# Patient Record
Sex: Male | Born: 1955
Health system: Southern US, Community
[De-identification: ages and names within clinical notes are randomized; demographics above are authoritative.]

## PROBLEM LIST (undated history)

## (undated) DIAGNOSIS — D509 Iron deficiency anemia, unspecified: Secondary | ICD-10-CM

## (undated) DIAGNOSIS — M353 Polymyalgia rheumatica: Secondary | ICD-10-CM

## (undated) DIAGNOSIS — H43812 Vitreous degeneration, left eye: Secondary | ICD-10-CM

## (undated) DIAGNOSIS — IMO0001 Reserved for inherently not codable concepts without codable children: Secondary | ICD-10-CM

## (undated) DIAGNOSIS — M47812 Spondylosis without myelopathy or radiculopathy, cervical region: Secondary | ICD-10-CM

## (undated) DIAGNOSIS — R404 Transient alteration of awareness: Secondary | ICD-10-CM

## (undated) DIAGNOSIS — E785 Hyperlipidemia, unspecified: Secondary | ICD-10-CM

## (undated) DIAGNOSIS — H269 Unspecified cataract: Secondary | ICD-10-CM

## (undated) DIAGNOSIS — Z800 Family history of malignant neoplasm of digestive organs: Secondary | ICD-10-CM

## (undated) DIAGNOSIS — M109 Gout, unspecified: Secondary | ICD-10-CM

## (undated) DIAGNOSIS — J309 Allergic rhinitis, unspecified: Secondary | ICD-10-CM

## (undated) DIAGNOSIS — K589 Irritable bowel syndrome without diarrhea: Secondary | ICD-10-CM

## (undated) DIAGNOSIS — K5790 Diverticulosis of intestine, part unspecified, without perforation or abscess without bleeding: Secondary | ICD-10-CM

## (undated) HISTORY — DX: Family history of malignant neoplasm of digestive organs: Z80.0

## (undated) HISTORY — DX: Transient alteration of awareness: R40.4

## (undated) HISTORY — DX: Polymyalgia rheumatica: M35.3

## (undated) HISTORY — DX: Iron deficiency anemia, unspecified: D50.9

## (undated) HISTORY — DX: Allergic rhinitis, unspecified: J30.9

## (undated) HISTORY — DX: Diverticulosis of intestine, part unspecified, without perforation or abscess without bleeding: K57.90

## (undated) HISTORY — DX: Hyperlipidemia, unspecified: E78.5

## (undated) HISTORY — DX: Gout, unspecified: M10.9

## (undated) HISTORY — DX: Irritable bowel syndrome without diarrhea: K58.9

## (undated) HISTORY — DX: Vitreous degeneration, left eye: H43.812

## (undated) HISTORY — DX: Reserved for inherently not codable concepts without codable children: IMO0001

## (undated) HISTORY — DX: Spondylosis without myelopathy or radiculopathy, cervical region: M47.812

## (undated) HISTORY — DX: Unspecified cataract: H26.9

---

## 2003-09-03 ENCOUNTER — Ambulatory Visit (HOSPITAL_COMMUNITY): Admission: RE | Admit: 2003-09-03 | Discharge: 2003-09-03 | Payer: Self-pay | Admitting: Gastroenterology

## 2003-09-06 ENCOUNTER — Encounter (INDEPENDENT_AMBULATORY_CARE_PROVIDER_SITE_OTHER): Payer: Self-pay | Admitting: *Deleted

## 2007-06-07 ENCOUNTER — Ambulatory Visit: Payer: Self-pay | Admitting: Internal Medicine

## 2007-06-07 DIAGNOSIS — J309 Allergic rhinitis, unspecified: Secondary | ICD-10-CM

## 2007-06-07 DIAGNOSIS — IMO0001 Reserved for inherently not codable concepts without codable children: Secondary | ICD-10-CM

## 2007-06-07 DIAGNOSIS — M47812 Spondylosis without myelopathy or radiculopathy, cervical region: Secondary | ICD-10-CM

## 2007-06-07 HISTORY — DX: Reserved for inherently not codable concepts without codable children: IMO0001

## 2007-06-07 HISTORY — DX: Allergic rhinitis, unspecified: J30.9

## 2007-06-07 HISTORY — DX: Spondylosis without myelopathy or radiculopathy, cervical region: M47.812

## 2007-06-14 ENCOUNTER — Telehealth: Payer: Self-pay | Admitting: Internal Medicine

## 2007-06-21 ENCOUNTER — Ambulatory Visit: Payer: Self-pay | Admitting: Internal Medicine

## 2007-06-21 LAB — CONVERTED CEMR LAB
Albumin ELP: 58.9 % (ref 55.8–66.1)
Alpha-2-Globulin: 9.9 % (ref 7.1–11.8)
Beta Globulin: 6.4 % (ref 4.7–7.2)
CRP: 0.1 mg/dL (ref <0.6)
Gamma Globulin: 15.3 % (ref 11.1–18.8)
IgG (Immunoglobin G), Serum: 1240 mg/dL (ref 694–1618)

## 2007-06-24 ENCOUNTER — Encounter: Payer: Self-pay | Admitting: Internal Medicine

## 2007-07-04 ENCOUNTER — Encounter: Admission: RE | Admit: 2007-07-04 | Discharge: 2007-07-04 | Payer: Self-pay | Admitting: Internal Medicine

## 2007-07-26 ENCOUNTER — Encounter: Payer: Self-pay | Admitting: Internal Medicine

## 2007-11-05 ENCOUNTER — Ambulatory Visit: Payer: Self-pay | Admitting: Internal Medicine

## 2007-11-05 DIAGNOSIS — D509 Iron deficiency anemia, unspecified: Secondary | ICD-10-CM | POA: Insufficient documentation

## 2007-11-05 HISTORY — DX: Iron deficiency anemia, unspecified: D50.9

## 2007-11-05 LAB — CONVERTED CEMR LAB
Eosinophils Absolute: 0.3 10*3/uL (ref 0.0–0.7)
Eosinophils Relative: 4 % (ref 0.0–5.0)
Iron: 97 ug/dL (ref 42–165)
MCV: 85.7 fL (ref 78.0–100.0)
Monocytes Absolute: 0.6 10*3/uL (ref 0.1–1.0)
Neutrophils Relative %: 57.9 % (ref 43.0–77.0)
Platelets: 307 10*3/uL (ref 150–400)
RDW: 12.2 % (ref 11.5–14.6)
Saturation Ratios: 24.6 % (ref 20.0–50.0)
Transferrin: 281.9 mg/dL (ref >=212.0)
WBC: 7.3 10*3/uL (ref 4.5–10.5)

## 2008-03-17 ENCOUNTER — Telehealth: Payer: Self-pay | Admitting: Internal Medicine

## 2008-11-24 ENCOUNTER — Emergency Department (HOSPITAL_COMMUNITY): Admission: EM | Admit: 2008-11-24 | Discharge: 2008-11-24 | Payer: Self-pay | Admitting: Emergency Medicine

## 2009-07-06 ENCOUNTER — Ambulatory Visit: Payer: Self-pay | Admitting: Internal Medicine

## 2009-07-06 DIAGNOSIS — R10814 Left lower quadrant abdominal tenderness: Secondary | ICD-10-CM | POA: Insufficient documentation

## 2009-07-06 LAB — CONVERTED CEMR LAB
Basophils Absolute: 0.1 10*3/uL (ref 0.0–0.1)
Eosinophils Absolute: 0.5 10*3/uL (ref 0.0–0.7)
HDL: 49.2 mg/dL (ref >39.00)
Hemoglobin: 14.7 g/dL (ref 13.0–17.0)
Lymphocytes Relative: 21.5 % (ref 12.0–46.0)
MCHC: 34.6 g/dL (ref 30.0–36.0)
Monocytes Relative: 10.7 % (ref 3.0–12.0)
Neutro Abs: 5.9 10*3/uL (ref 1.4–7.7)
Neutrophils Relative %: 62.1 % (ref 43.0–77.0)
Platelets: 332 10*3/uL (ref 150.0–400.0)
RDW: 13.4 % (ref 11.5–14.6)

## 2009-08-07 ENCOUNTER — Ambulatory Visit: Payer: Self-pay | Admitting: Family Medicine

## 2010-01-08 ENCOUNTER — Ambulatory Visit: Payer: Self-pay | Admitting: Family Medicine

## 2010-01-08 DIAGNOSIS — R109 Unspecified abdominal pain: Secondary | ICD-10-CM | POA: Insufficient documentation

## 2010-01-08 LAB — CONVERTED CEMR LAB
Bilirubin Urine: NEGATIVE
Blood in Urine, dipstick: NEGATIVE
Nitrite: NEGATIVE

## 2010-01-24 ENCOUNTER — Telehealth: Payer: Self-pay | Admitting: Internal Medicine

## 2010-02-04 ENCOUNTER — Ambulatory Visit: Payer: Self-pay | Admitting: Internal Medicine

## 2010-02-04 ENCOUNTER — Encounter (INDEPENDENT_AMBULATORY_CARE_PROVIDER_SITE_OTHER): Payer: Self-pay | Admitting: *Deleted

## 2010-02-04 DIAGNOSIS — L509 Urticaria, unspecified: Secondary | ICD-10-CM | POA: Insufficient documentation

## 2010-02-04 DIAGNOSIS — K589 Irritable bowel syndrome without diarrhea: Secondary | ICD-10-CM | POA: Insufficient documentation

## 2010-02-04 HISTORY — DX: Irritable bowel syndrome, unspecified: K58.9

## 2010-02-04 LAB — CONVERTED CEMR LAB
Basophils Absolute: 0.1 10*3/uL (ref 0.0–0.1)
Basophils Relative: 1.2 % (ref 0.0–3.0)
Eosinophils Absolute: 0.3 10*3/uL (ref 0.0–0.7)
Hemoglobin: 14.9 g/dL (ref 13.0–17.0)
Lymphocytes Relative: 24.4 % (ref 12.0–46.0)
MCHC: 34.1 g/dL (ref 30.0–36.0)
Monocytes Relative: 9.5 % (ref 3.0–12.0)
Neutro Abs: 4.4 10*3/uL (ref 1.4–7.7)
Neutrophils Relative %: 60.8 % (ref 43.0–77.0)
RBC: 5 M/uL (ref 4.22–5.81)

## 2010-03-21 ENCOUNTER — Ambulatory Visit: Payer: Self-pay | Admitting: Gastroenterology

## 2010-03-21 DIAGNOSIS — K5900 Constipation, unspecified: Secondary | ICD-10-CM | POA: Insufficient documentation

## 2010-05-03 NOTE — Assessment & Plan Note (Signed)
Summary: ROA/ABD PAINS/RCD   Vital Signs:  Patient profile:   55 year old male Height:      68 inches Weight:      172 pounds BMI:     26.25 Temp:     98.2 degrees F oral Pulse rate:   72 / minute Resp:     14 per minute BP sitting:   130 / 80  (left arm)  Vitals Entered By: Willy Eddy, LPN (July 06, 9560 4:19 PM) CC: c/o increased gas since october with a moving feeling in lower abd and indigestion   CC:  c/o increased gas since october with a moving feeling in lower abd and indigestion.  History of Present Illness: increased gas and GI discomfort that started about NOV has been off aleve but was using this prio tryed a change in diet and avoidance of coffee still hurting increased gas sounds no pain stools are "normal" no pale stools the bloating in the left lower quadrant  colon in 2005  at [facility]  Preventive Screening-Counseling & Management  Alcohol-Tobacco     Smoking Status: never     Passive Smoke Exposure: no  Problems Prior to Update: 1)  Anemia-iron Deficiency  (ICD-280.9) 2)  Cervical Spondylosis Without Myelopathy  (ICD-721.0) 3)  Myofascial Pain Syndrome  (ICD-729.1) 4)  Family History of Colon Ca 1st Degree Relative <60  (ICD-V16.0) 5)  Allergic Rhinitis  (ICD-477.9)  Current Problems (verified): 1)  Anemia-iron Deficiency  (ICD-280.9) 2)  Cervical Spondylosis Without Myelopathy  (ICD-721.0) 3)  Myofascial Pain Syndrome  (ICD-729.1) 4)  Family History of Colon Ca 1st Degree Relative <60  (ICD-V16.0) 5)  Allergic Rhinitis  (ICD-477.9)  Medications Prior to Update: 1)  Aleve 220 Mg  Caps (Naproxen Sodium) .... As Needed For Neck and Arm Pain. 2)  Valtrex 500 Mg Tabs (Valacyclovir Hcl) .... One By Mouth Three Times A Day X 5 Days  Current Medications (verified): 1)  Aleve 220 Mg  Caps (Naproxen Sodium) .... As Needed For Neck and Arm Pain.  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  Past Medical History: Reviewed history from 11/05/2007 and no changes required. Allergic rhinitis Anemia-iron deficiency  Past Surgical History: Reviewed history from 2007-07-03 and no changes required. no polyps  in screening colon...Marland KitchenMarland Kitchen 10 years was what he was told...Marland KitchenMarland Kitchen 5 yearsago  Family History: Reviewed history from July 03, 2007 and no changes required. Father: deceased:heart disease Mother: deceased:colon ca All siblings healthy except for one brother has hyperlipidmia Family History of Colon CA 1st degree relative <60 Family History High cholesterol Family History Hypertension, smoker  Social History: Reviewed history from 07/03/2007 and no changes required.  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 As needed for neck and arm pain.  Other Orders: Venipuncture (13086) TLB-CBC Platelet - w/Differential (85025-CBCD) TLB-Cholesterol, HDL (83718-HDL)   Preventive Care Screening  Colonoscopy:    Date:  07/03/2003    Next Due:  04/2011  Results:  normal

## 2010-05-03 NOTE — Letter (Signed)
Summary: New Patient letter  [facility]  [address] [place], [place] [address]   Phone: [phone]  Fax: [phone]       02/04/2010 MRN: [id number]  [facility] [address] [place], [place]  [address]  Dear David Arias,  Welcome to the Gastroenterology Division at [facility].    You are scheduled to see Dr.  Russella Dar on 03-21-10 at 9:30a.m. on the 3rd floor at [facility], [address]  We ask that you try to arrive at our office 15 minutes prior to your appointment time to allow for check-in.  We would like you to complete the enclosed self-administered evaluation form prior to your visit and bring it with you on the day of your appointment.  We will review it with you.  Also, please bring a complete list of all your medications or, if you prefer, bring the medication bottles and we will list them.  Please bring your insurance card so that we may make a copy of it.  If your insurance requires a referral to see a specialist, please bring your referral form from your primary care physician.  Co-payments are due at the time of your visit and may be paid by cash, check or credit card.     Your office visit will consist of a consult with your physician (includes a physical exam), any laboratory testing he/she may order, scheduling of any necessary diagnostic testing (e.g. x-ray, ultrasound, CT-scan), and scheduling of a procedure (e.g. Endoscopy, Colonoscopy) if required.  Please allow enough time on your schedule to allow for any/all of these possibilities.    If you cannot keep your appointment, please call [phone] to cancel or reschedule prior to your appointment date.  This allows [place] the opportunity to schedule an appointment for another patient in need of care.  If you do not cancel or reschedule by 5 p.m. the business day prior to your appointment date, you will be charged a $50.00 late cancellation/no-show fee.    Thank you for choosing  [facility] for your medical needs.  We appreciate the opportunity to care for you.  Please visit [place] at our website  to learn more about our practice.                     Sincerely,                                                             The Gastroenterology Division

## 2010-05-03 NOTE — Assessment & Plan Note (Signed)
Summary: SORE THROAT,CONGESTION...AS.   Vital Signs:  Patient profile:   55 year old male Height:      68 inches Weight:      170.8 pounds Temp:     97.4 degrees F BP sitting:   134 / 80  Vitals Entered By: Shary Decamp (Aug 07, 2009 9:19 AM) CC: sore throat, nasal congestion x 3 days   History of Present Illness: Pt here on Sat clinic for ST and being very congested and very achey. He thinks he has srep. He has no fever or chills, mild headache right temporal area, no ear pain but sluggish, rhinitis that is clear, Big time ST, no cough, no SOB, no N/V. He has taken mucinex yesterday "D" twice.  Problems Prior to Update: 1)  Abdominal Tenderness, Left Lower Quadrant  (ICD-789.64) 2)  Anemia-iron Deficiency  (ICD-280.9) 3)  Cervical Spondylosis Without Myelopathy  (ICD-721.0) 4)  Myofascial Pain Syndrome  (ICD-729.1) 5)  Family History of Colon Ca 1st Degree Relative <60  (ICD-V16.0) 6)  Allergic Rhinitis  (ICD-477.9)  Medications Prior to Update: 1)  Aleve 220 Mg  Caps (Naproxen Sodium) .... As Needed For Neck and Arm Pain.  Current Medications (verified): 1)  Aleve 220 Mg  Caps (Naproxen Sodium) .... As Needed For Neck and Arm Pain.  Allergies (verified): No Known Drug Allergies  Physical Exam  General:  Well-developed,well-nourished,in no acute distress; alert,appropriate and cooperative throughout examination Head:  Normocephalic and atraumatic without obvious abnormalities. No apparent alopecia but early male pattern  balding. Sinuses NT. Eyes:  pupils equal and pupils round.   Ears:  External ear exam shows no significant lesions or deformities.  Otoscopic examination reveals clear canals, tympanic membranes are intact bilaterally without bulging, retraction, inflammation or discharge. Hearing is grossly normal bilaterally. Nose:  External nasal examination shows no deformity or inflammation. Nasal mucosa are pink and moist without lesions or exudates. Mouth:  Oral  mucosa and oropharynx without lesions or exudates.  Teeth in good repair. Minimal erythema of pharyngeal pillars. Neck:  No deformities, masses, or tenderness noted. No adenopathy. Lungs:  Normal respiratory effort, chest expands symmetrically. Lungs are clear to auscultation, no crackles or wheezes. Heart:  Normal rate and regular rhythm. S1 and S2 normal without gallop, murmur, click, rub or other extra sounds.   Impression & Recommendations:  Problem # 1:  URI (ICD-465.9) Assessment New  See instructions His updated medication list for this problem includes:    Aleve 220 Mg Caps (Naproxen sodium) .Marland Kitchen... As needed for neck and arm pain.  Instructed on symptomatic treatment. Call if symptoms persist or worsen.   Complete Medication List: 1)  Aleve 220 Mg Caps (Naproxen sodium) .... As needed for neck and arm pain.  Patient Instructions: 1)  Take Guaifenesin by going to [facility] or [facility] and getting MUCOUS RELIEF EXPECTORANT/CONGESTION/CHEST (400mg ), take 11/2 tabs by mouth AM and NOON. 2)  Drink lots of fluids anytime taking Guaifenesin.  3)  TAKE ALEVE 2 AFTER BRFST AND SUPPER. 4)  Gargle with warm salt water every half hr for 2 days. 5)  Keep lozenge in mouth to coat throat to avoid "reactive cough."

## 2010-05-03 NOTE — Progress Notes (Signed)
Summary: Pt req to get copy of last lab results  Phone Note Call from Patient Call back at Work Phone [phone]   Caller: Patient Summary of Call: Pt is req to get copy of his last lab results. Pt said he had this mailed to him in past.   Initial call taken by: Lucy Antigua,  January 24, 2010 12:31 PM  Follow-up for Phone Call        left message on machine - due to privacy dont like to mail labs- so copy of labs are up front for pt to picd up Follow-up by: Willy Eddy, LPN,  January 24, 2010 12:53 PM

## 2010-05-03 NOTE — Assessment & Plan Note (Signed)
Summary: LOWER ABDOMINAL CRAMPING/DLO   Vital Signs:  Patient profile:   55 year old male Height:      68 inches Weight:      168.75 pounds BMI:     25.75 O2 Sat:      99 % on Room air Temp:     97.8 degrees F oral Pulse rate:   68 / minute BP sitting:   130 / 80  (left arm) Cuff size:   regular  Vitals Entered By: Jarome Lamas (January 08, 2010 10:31 AM)  O2 Flow:  Room air CC: abdomen discomfort/pb   History of Present Illness: has lower abdominal discomfort -- not really pain  is fullness and bloating at times - that comes and goes  has gone on for a long time  saw Dr in june -- did some blood work and it was fine (wanted to rule out appendicitis)  now is bothering him more  has changed diet several times-- no triggers  happens 3-4 days per week  some intermittent constipation as well - that comes and goes  improved after big bowel movement  no blood in stool  no dark stool   had a colonoscopy in the past 7 years ago   no polyps  Dr Lovell Sheehan has set him up for january   is having hearburn the past 2 days  no meds otc   eats apples for fiber   mother passed with colon ca at 65    Current Medications (verified): 1)  Aleve 220 Mg  Caps (Naproxen Sodium) .... As Needed For Neck and Arm Pain.  Allergies (verified): No Known Drug Allergies  Past History:  Past Medical History: Last updated: 11/05/2007 Allergic rhinitis Anemia-iron deficiency  Past Surgical History: Last updated: 20-Jun-2007 no polyps  in screening colon...Marland KitchenMarland Kitchen 10 years was what he was told...Marland KitchenMarland Kitchen 5 yearsago  Family History: Last updated: 2007-06-20 Father: deceased:heart disease Mother: deceased:colon ca All siblings healthy except for one brother has hyperlipidmia Family History of Colon CA 1st degree relative <60 Family History High cholesterol Family History Hypertension, smoker  Social History: Last updated: 2007/06/20  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 Eyes:  Denies blurring and eye irritation. CV:  Denies chest pain or discomfort and palpitations. Resp:  Denies cough and wheezing. GI:  Complains of abdominal pain, change in bowel habits, constipation, gas, and indigestion; denies dark tarry stools, nausea, and vomiting. GU:  Denies dysuria, hematuria, and urinary frequency. MS:  Denies joint pain. Derm:  Denies rash. Neuro:  Denies numbness and tingling.  Physical Exam  General:  Well-developed,well-nourished,in no acute distress; alert,appropriate and cooperative throughout examination Head:  normocephalic, atraumatic, and no abnormalities observed.   Eyes:  vision grossly intact, pupils equal, pupils round, and pupils reactive to light.  no conjunctival pallor, injection or icterus  Mouth:  pharynx pink and moist.   Neck:  supple with full rom and no masses or thyromegally, no JVD or carotid bruit  Lungs:  Normal respiratory effort, chest expands symmetrically. Lungs are clear to auscultation, no crackles or wheezes. Heart:  Normal rate and regular rhythm. S1 and S2 normal without gallop, murmur, click, rub or other extra sounds. Abdomen:  mild bilat LQ tenderness worse on L without gaurding or rebound  soft, non-tender, normal bowel sounds, no hepatomegaly, and no splenomegaly.  Extremities:  No clubbing, cyanosis, edema, or deformity noted with normal full range of motion of all joints.   Neurologic:  sensation intact to light touch, gait normal, and DTRs symmetrical and normal.   Skin:  Intact without suspicious lesions or rashes Cervical Nodes:  No lymphadenopathy noted Inguinal Nodes:  No significant adenopathy Psych:  normal affect, talkative and pleasant    Impression & Recommendations:  Problem # 1:  ABDOMINAL PAIN, LOWER  (ICD-789.09)  upon symptom review suspect possible ibs  adv more fiber and water fiber suppl daily call for GI consult given fam hx of ca -- may need early colonosc  His updated medication list for this problem includes:    Aleve 220 Mg Caps (Naproxen sodium) .Marland Kitchen... As needed for neck and arm pain.  Orders: UA Dipstick w/o Micro (manual) (01093)  Complete Medication List: 1)  Aleve 220 Mg Caps (Naproxen sodium) .... As needed for neck and arm pain.  Patient Instructions: 1)  start citrucel one serving with water as directed  2)  eat general high fiber diet  3)  drink lots of water  4)  urine test is clear today  5)  call your primary care doctor next week and ask for a GI consult  6)  I suspect possible irritable bowel syndrome   Laboratory Results   Urine Tests    Routine Urinalysis   Color: yellow Appearance: Clear Glucose: negative   (Normal Range: Negative) Bilirubin: negative   (Normal Range: Negative) Ketone: negative   (Normal Range: Negative) Spec. Gravity: 1.010   (Normal Range: 1.003-1.035) Blood: negative   (Normal Range: Negative) pH: 7.5   (Normal Range: 5.0-8.0) Protein: trace   (Normal Range: Negative) Urobilinogen: 0.2   (Normal Range: 0-1) Nitrite: negative   (Normal Range: Negative)

## 2010-05-03 NOTE — Assessment & Plan Note (Signed)
Summary: consult re: recurring abdominal pain/cjr   Vital Signs:  Patient profile:   55 year old male Height:      68 inches Weight:      168 pounds Temp:     98.2 degrees F oral Pulse rate:   72 / minute Resp:     14 per minute BP sitting:   130 / 80  (left arm)  Vitals Entered By: Willy Eddy, LPN (February 04, 2010 9:47 AM) CC: continues to c/o lower abd pain/bmw Is Patient Diabetic? No   Primary Care Provider:  Stacie Glaze MD  CC:  continues to c/o lower abd pain/bmw.  History of Present Illness: the pt still has discomfort in the abdominal area the working diagnosis is IBS hx of colon and is due in Jan of this coming year may need accelerated consult with GI for IBS and early colon the pattern is every two to three days with some alternating constipation and normal also hjas noted periodic hives cannot relate to foods  Preventive Screening-Counseling & Management  Alcohol-Tobacco     Smoking Status: never     Passive Smoke Exposure: no     Tobacco Counseling: not indicated; no tobacco use  Current Problems (verified): 1)  Abdominal Pain, Lower  (ICD-789.09) 2)  Abdominal Tenderness, Left Lower Quadrant  (ICD-789.64) 3)  Anemia-iron Deficiency  (ICD-280.9) 4)  Cervical Spondylosis Without Myelopathy  (ICD-721.0) 5)  Myofascial Pain Syndrome  (ICD-729.1) 6)  Family History of Colon Ca 1st Degree Relative <60  (ICD-V16.0) 7)  Allergic Rhinitis  (ICD-477.9)  Current Medications (verified): 1)  Aleve 220 Mg  Caps (Naproxen Sodium) .... As Needed For Neck and Arm Pain. 2)  Align 4 Mg Caps (Probiotic Product) .... One By Mouth Daily 3)  Hyoscyamine Sulfate 0.125 Mg Tabs (Hyoscyamine Sulfate) .... 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  Past Medical History: Reviewed history from 11/05/2007 and no changes required. Allergic rhinitis Anemia-iron deficiency  Past Surgical History: Reviewed history from 06-28-07 and no changes required. no polyps  in screening colon...Marland KitchenMarland Kitchen 10 years was what he was told...Marland KitchenMarland Kitchen 5 yearsago  Family History: Reviewed history from June 28, 2007 and no changes required. Father: deceased:heart disease Mother: deceased:colon ca All siblings healthy except for one brother has hyperlipidmia Family History of Colon CA 1st degree relative <60 Family History High cholesterol Family History Hypertension, smoker  Social History: Reviewed history from 06-28-07 and no changes required.  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    Physical Exam  General:  Well-developed,well-nourished,in no acute distress;  alert,appropriate and cooperative throughout examination Head:  normocephalic, atraumatic, and no abnormalities observed.   Eyes:  vision grossly intact, pupils equal, pupils round, and pupils reactive to light.  no conjunctival pallor, injection or icterus  Ears:  R ear normal and L ear normal.   Neck:  supple with full rom and no masses or thyromegally, no JVD or carotid bruit  Lungs:  Normal respiratory effort, chest expands symmetrically. Lungs are clear to auscultation, no crackles or wheezes. Heart:  Normal rate and regular rhythm. S1 and S2 normal without gallop, murmur, click, rub or other extra sounds. Abdomen:  mild bilat LQ tenderness worse on L without gaurding or rebound  soft, non-tender, normal bowel sounds, no hepatomegaly, and no splenomegaly.     Impression & Recommendations:  Problem # 1:  ABDOMINAL PAIN, LOWER (ICD-789.09)  IBS working dx His updated medication list for this problem includes:    Aleve 220 Mg Caps (Naproxen sodium) .Marland Kitchen... As needed for neck and arm pain.  Discussed use of medications, application of heat or cold, and exercises.   Orders: Gastroenterology Referral (GI)  Problem # 2:  IRRITABLE BOWEL SYNDROME (ICD-564.1) will try medical therapy with antispasmotics and probiotic Literature given on IBS align trial  one by mouth daily add levsin .125 as needed and refer to GI  Orders: Gastroenterology Referral (GI) for IBS and colon cancer screen  Problem # 3:  FAMILY HISTORY OF COLON CA 1ST DEGREE RELATIVE <60 (ICD-V16.0)  Orders: Gastroenterology Referral (GI)  Problem # 4:  URTICARIA (ICD-708.9)  periodice hives lmit dairy? gluten intolerance? check ige alergy profile  Orders: Venipuncture (81191) T-Allergy Profile Region [place], [place], [place], [place], [place] [phone]) T-Food Allergy Profile Specific IgE (86003/[id number]) Specimen Handling (95621) TLB-CBC Platelet - w/Differential (85025-CBCD)  Complete Medication List: 1)  Aleve 220 Mg Caps  (Naproxen sodium) .... As needed for neck and arm pain. 2)  Align 4 Mg Caps (Probiotic product) .... One by mouth daily 3)  Hyoscyamine Sulfate 0.125 Mg Tabs (Hyoscyamine sulfate) .... One by mouth ever7 2-4 hours as needed  Patient Instructions: 1)  Please schedule a follow-up appointment in 2 months. Prescriptions: HYOSCYAMINE SULFATE 0.125 MG TABS (HYOSCYAMINE SULFATE) one by mouth ever7 2-4 hours as needed  #30 x 11   Entered and Authorized by:   Stacie Glaze MD   Signed by:   Stacie Glaze MD on 02/04/2010   Method used:   Electronically to        [facility] (retail)       [address]       [place], [place]  [address]       Ph: [phone]       Fax: [phone]   RxID:   [id number]    Orders Added: 1)  Est. Patient Level IV [95638] 2)  Venipuncture [75643] 3)  T-Allergy Profile Region [place], [place], [place], [place], [place] [5484] 4)  T-Food Allergy Profile Specific IgE [86003/[id number]] 5)  Specimen Handling [99000] 6)  Gastroenterology Referral [GI] 7)  TLB-CBC Platelet - w/Differential [85025-CBCD]

## 2010-05-04 ENCOUNTER — Ambulatory Visit: Admit: 2010-05-04 | Payer: Self-pay | Admitting: Gastroenterology

## 2010-05-04 ENCOUNTER — Other Ambulatory Visit: Payer: Self-pay | Admitting: Gastroenterology

## 2010-05-05 NOTE — Letter (Signed)
Summary: [facility] Instructions  [facility]  [address] [place], [place] [address]   Phone: [phone]  Fax: [phone]       David Arias    06/30/1955    MRN: [id number]        Procedure Day Dorna Bloom: Wednesday February 1st, 2012     Arrival Time: 9:00am     Procedure Time: 10:00am     Location of Procedure:                    _x _  [facility] (4th Floor)                        PREPARATION FOR COLONOSCOPY WITH MOVIPREP   Starting 5 days prior to your procedure 04/29/10 do not eat nuts, seeds, popcorn, corn, beans, peas,  salads, or any raw vegetables.  Do not take any fiber supplements (e.g. Metamucil, Citrucel, and Benefiber).  THE DAY BEFORE YOUR PROCEDURE         DATE: 05/03/10  DAY: Tuesday  1.  Drink clear liquids the entire day-NO SOLID FOOD  2.  Do not drink anything colored red or purple.  Avoid juices with pulp.  No orange juice.  3.  Drink at least 64 oz. (8 glasses) of fluid/clear liquids during the day to prevent dehydration and help the prep work efficiently.  CLEAR LIQUIDS INCLUDE: Water Jello Ice Popsicles Tea (sugar ok, no milk/cream) Powdered fruit flavored drinks Coffee (sugar ok, no milk/cream) Gatorade Juice: apple, white grape, white cranberry  Lemonade Clear bullion, consomm, broth Carbonated beverages (any kind) Strained chicken noodle soup Hard Candy                             4.  In the morning, mix first dose of MoviPrep solution:    Empty 1 Pouch A and 1 Pouch B into the disposable container    Add lukewarm drinking water to the top line of the container. Mix to dissolve    Refrigerate (mixed solution should be used within 24 hrs)  5.  Begin drinking the prep at 5:00 p.m. The MoviPrep container is divided by 4 marks.   Every 15 minutes drink the solution down to the next mark (approximately 8 oz) until the full liter is complete.   6.  Follow completed prep with 16 oz of clear liquid of your choice  (Nothing red or purple).  Continue to drink clear liquids until bedtime.  7.  Before going to bed, mix second dose of MoviPrep solution:    Empty 1 Pouch A and 1 Pouch B into the disposable container    Add lukewarm drinking water to the top line of the container. Mix to dissolve    Refrigerate  THE DAY OF YOUR PROCEDURE      DATE: 05/04/10 DAY: Wednesday  Beginning at 5:00 a.m. (5 hours before procedure):         1. Every 15 minutes, drink the solution down to the next mark (approx 8 oz) until the full liter is complete.  2. Follow completed prep with 16 oz. of clear liquid of your choice.    3. You may drink clear liquids until 8:00am (2 HOURS BEFORE PROCEDURE).   MEDICATION INSTRUCTIONS  Unless otherwise instructed, you should take regular prescription medications with a small sip of water   as early as possible the morning of your  procedure.        OTHER INSTRUCTIONS  You will need a responsible adult at least 56 years of age to accompany you and drive you home.   This person must remain in the waiting room during your procedure.  Wear loose fitting clothing that is easily removed.  Leave jewelry and other valuables at home.  However, you may wish to bring a book to read or  an iPod/MP3 player to listen to music as you wait for your procedure to start.  Remove all body piercing jewelry and leave at home.  Total time from sign-in until discharge is approximately 2-3 hours.  You should go home directly after your procedure and rest.  You can resume normal activities the  day after your procedure.  The day of your procedure you should not:   Drive   Make legal decisions   Operate machinery   Drink alcohol   Return to work  You will receive specific instructions about eating, activities and medications before you leave.    The above instructions have been reviewed and explained to me by   Marchelle Folks.     I fully understand and can verbalize these  instructions _____________________________ Date _________

## 2010-05-05 NOTE — Assessment & Plan Note (Signed)
Summary: IBS--ch.   History of Present Illness Visit Type: Initial Consult Primary GI MD: Elie Goody MD [facility] Primary Provider: Stacie Glaze MD Requesting Provider: Stacie Glaze MD Chief Complaint: IBS c/o lower abdominal discomfort which has gotten worse over time.   History of Present Illness:   This is a 55 year old male with chronic constipation and lower abdominal discomfort. The constipation has been a problem since childhood and he notes a lower abdominal discomfort for approximately one year. He notes the lower abdominal discomfort tends to be worse when he is more constipated. He has a family history of colon cancer with a mother who was diagnosed in her 106s. He underwent colonoscopy by Dr. Reece Agar in 09/2003 which was unremarkable.  He notes occasional heartburn maybe twice a month, which resolves on its own. He tends to develop heartburn when he eats and lays down after a meal.   GI Review of Systems    Reports abdominal pain and  heartburn.     Location of  Abdominal pain: lower abdomen.    Denies acid reflux, belching, bloating, chest pain, dysphagia with liquids, dysphagia with solids, loss of appetite, nausea, vomiting, vomiting blood, weight loss, and  weight gain.      Reports constipation.     Denies anal fissure, black tarry stools, change in bowel habit, diarrhea, diverticulosis, fecal incontinence, heme positive stool, hemorrhoids, irritable bowel syndrome, jaundice, light color stool, liver problems, rectal bleeding, and  rectal pain.   Current Medications (verified): 1)  Aleve 220 Mg  Caps (Naproxen Sodium) .... As Needed For Neck and Arm Pain. 2)  Hyoscyamine Sulfate 0.125 Mg Tabs (Hyoscyamine Sulfate) .... One By Mouth Ever7 2-4 Hours As Needed  (Has Not Tried Yet)  Allergies (verified): No Known Drug Allergies  Past History:  Past Medical History: Allergic rhinitis Anemia-iron deficiency (20 years ago)  Past Surgical History: Reviewed  history from 06/07/2007 and no changes required. no polyps  in screening colon...Marland KitchenMarland Kitchen 10 years was what he was told...Marland KitchenMarland Kitchen 5 yearsago  Family History: Reviewed history from 03/16/2010 and no changes required. Father: deceased:heart disease Mother: deceased:colon ca dx at age 24. All siblings healthy except for one brother has hyperlipidmia Family History of Colon CA 1st degree relative <60 Family History High cholesterol Family History Hypertension, smoker  Social History: Reviewed history from 06/07/2007 and no changes required. 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 All other ROS were reviewed and were negative.   Vital Signs:  Patient profile:   55 year old male Height:      68 inches Weight:      169 pounds BMI:     25.79 Pulse rate:   84 / minute Pulse rhythm:   regular BP sitting:   124 / 80  (left arm)  Vitals Entered By: Milford Cage NCMA (March 21, 2010 9:40 AM)  Physical Exam  General:  Well developed, well nourished, no acute distress. Head:  Normocephalic and atraumatic. Eyes:  PERRLA, no icterus. Ears:  Normal auditory acuity. Mouth:  No deformity or lesions, dentition normal. Neck:  Supple; no masses or thyromegaly. Lungs:  Clear throughout to auscultation. Heart:  Regular rate and rhythm; no murmurs, rubs,  or bruits. Abdomen:  Soft, nontender and nondistended. No masses, hepatosplenomegaly or hernias noted.  Normal bowel sounds. Rectal:  deferred until time of colonoscopy.   Msk:  Symmetrical with no gross deformities. Normal posture. Pulses:  Normal pulses noted. Extremities:  No clubbing, cyanosis, edema or deformities noted. Neurologic:  Alert and  oriented x4;  grossly normal neurologically. Cervical Nodes:  No significant cervical adenopathy. Inguinal Nodes:  No  significant inguinal adenopathy. Psych:  Alert and cooperative. Normal mood and affect.  Impression & Recommendations:  Problem # 1:  ABDOMINAL PAIN, LOWER (ICD-789.09) Mild lower abdominal discomfort associated with constipation. Increase dietary fiber and daily water intake. Trial of a probiotic. Colonoscopy as below. Consider further evaluation with imaging studies if his symptoms above measures and anti-spasmodics.  Problem # 2:  CONSTIPATION (ICD-564.00) See problem #1. Orders: Colonoscopy (Colon)  Problem # 3:  FAMILY HISTORY OF COLON CA 1ST DEGREE RELATIVE <60 (ICD-V16.0) Mother with colon cancer in her 45s. The risks, benefits and alternatives to colonoscopy with possible biopsy and possible polypectomy were discussed with the patient and they consent to proceed. The procedure will be scheduled electively. Orders: Colonoscopy (Colon)  Patient Instructions: 1)  Pick up your prep from your pharmacy.  2)  Colonoscopy brochure given.  3)  Avoid foods high in acid content ( tomatoes, citrus juices, spicy foods) . Avoid eating within 3 to 4 hours of lying down or before exercising. Do not over eat; try smaller more frequent meals. Elevate head of bed four inches when sleeping.  4)  High Fiber, Low Fat  Healthy Eating Plan brochure given.  5)  Copy sent to : Stacie Glaze, MD 6)  The medication list was reviewed and reconciled.  All changed / newly prescribed medications were explained.  A complete medication list was provided to the patient / caregiver.  Prescriptions: MOVIPREP 100 GM  SOLR (PEG-KCL-NACL-NASULF-NA ASC-C) As per prep instructions.  #1 x 0   Entered by:   Christie Nottingham CMA (AAMA)   Authorized by:   Meryl Dare MD [facility]   Signed by:   Christie Nottingham CMA (AAMA) on 03/21/2010   Method used:   Electronically to        [facility]* (retail)       [address]       [place], [place]  [address]       Ph: [phone]       Fax: [phone]    RxID:   [id number]

## 2010-05-05 NOTE — Op Note (Signed)
Summary: Colonoscopy  NAME:  David Arias, David Arias                             ACCOUNT NO.:  [id number]   MEDICAL RECORD NO.:  [id number]                   PATIENT TYPE:  AMB   LOCATION:  ENDO                                 FACILITY:  [facility]   PHYSICIAN:  Danise Edge, M.D.                DATE OF BIRTH:  03/17/1956   DATE OF PROCEDURE:  09/03/2003  DATE OF DISCHARGE:                                 OPERATIVE REPORT   PROCEDURE:  Colonoscopy.   REFERRED BY:  Thora Lance, M.D.   INDICATIONS FOR PROCEDURE:  David Arias is a 55 year old male born 1955/07/09.  [name] has intermittent chronic constipation associated with  hematochezia.  His mother was diagnosed with colon cancer at age 42.   ENDOSCOPIST:  Danise Edge, M.D.   PREMEDICATION:  Versed 8 mg, Demerol 50 mg.   DESCRIPTION OF PROCEDURE:  After obtaining informed consent, David Arias was  placed in the left lateral decubitus position. I administered intravenous  Demerol and intravenous Versed to achieve conscious sedation for the  procedure. The patient's blood pressure, oxygen saturation and cardiac  rhythm were monitored throughout the procedure and documented in the medical  record.   Anal inspection was normal and digital rectal exam was normal. The Olympus  adjustable pediatric colonoscope was introduced into the rectum and advanced  to the cecum.  A normal appearing ileocecal valve was intubated and the  distal ileum inspected.  Colonic preparation for the exam today was  excellent.   RECTUM:  Normal.   SIGMOID COLON AND DESCENDING COLON:  Normal.   SPLENIC FLEXURE:  Normal.   TRANSVERSE COLON:  Normal.   HEPATIC FLEXURE:  Normal.   ASCENDING COLON:  Normal.   CECUM AND ILEOCECAL VALVE:  Normal.   DISTAL ILEUM:  Normal.   ASSESSMENT:  Normal proctocolonoscopy to the cecum.   RECOMMENDATIONS:  Repeat colonoscopy in five years.                                               Danise Edge,  M.D.    MJ/MEDQ  D:  09/03/2003  T:  09/03/2003  Job:  [id number]   cc:   Thora Lance, M.D.  [address]  [place]  [place] [address]  Fax: [phone]

## 2010-06-06 ENCOUNTER — Encounter: Payer: Self-pay | Admitting: Gastroenterology

## 2010-06-06 ENCOUNTER — Other Ambulatory Visit (AMBULATORY_SURGERY_CENTER): Payer: BC Managed Care – PPO | Admitting: Gastroenterology

## 2010-06-06 DIAGNOSIS — Z1211 Encounter for screening for malignant neoplasm of colon: Secondary | ICD-10-CM

## 2010-06-06 DIAGNOSIS — Z800 Family history of malignant neoplasm of digestive organs: Secondary | ICD-10-CM

## 2010-06-06 DIAGNOSIS — K648 Other hemorrhoids: Secondary | ICD-10-CM

## 2010-06-14 NOTE — Procedures (Signed)
Summary: Colonoscopy  Patient: David Arias Note: All result statuses are Final unless otherwise noted.  Tests: (1) Colonoscopy (COL)   COL Colonoscopy           DONE     [facility]     [address]     [place], [place]  [address]          COLONOSCOPY PROCEDURE REPORT          PATIENT:  David, Arias  MR#:  [id number]     BIRTHDATE:  [birth year], 54 yrs. old  GENDER:  male     ENDOSCOPIST:  Judie Petit T. Russella Dar, MD, [facility]          PROCEDURE DATE:  06/06/2010     PROCEDURE:  Colonoscopy [id number]     ASA CLASS:  Class II     INDICATIONS:  1) Elevated Risk Screening  2) family history of     colon cancer: mother in her 4s.     MEDICATIONS:   Fentanyl 50 mcg IV, Versed 5 mg IV     DESCRIPTION OF PROCEDURE:   After the risks benefits and     alternatives of the procedure were thoroughly explained, informed     consent was obtained.  Digital rectal exam was performed and     revealed no abnormalities. The LB PCF-H180AL [id number] endoscope was     introduced through the anus and advanced to the cecum, which was     identified by both the appendix and ileocecal valve, without     limitations. The quality of the prep was excellent, using     MoviPrep.  The instrument was then slowly withdrawn as the colon     was fully examined.     <<PROCEDUREIMAGES>>     FINDINGS:  A normal appearing cecum, ileocecal valve, and     appendiceal orifice were identified. The ascending, hepatic     flexure, transverse, splenic flexure, descending, sigmoid colon,     and rectum appeared unremarkable. Retroflexed views in the rectum     revealed internal hemorrhoids, small. The time to cecum =  2.67     minutes. The scope was then withdrawn (time =  13.25  min) from the     patient and the procedure completed.          COMPLICATIONS:  None          ENDOSCOPIC IMPRESSION:     1) Normal colon     2) Internal hemorrhoids          RECOMMENDATIONS:     1) High fiber diet with liberal fluid  intake.     2) Repeat Colonoscopy in 5 years.     3) call office to schedule followup visit in 4-6 weeks          Malcolm T. Russella Dar, MD, [facility]          CC:  Stacie Glaze, MD          n.     Rosalie DoctorVenita Lick. Stark at 06/06/2010 03:47 PM          Elray Buba, [id number]  Note: An exclamation mark (!) indicates a result that was not dispersed into the flowsheet. Document Creation Date: 06/06/2010 3:47 PM _______________________________________________________________________  (1) Order result status: Final Collection or observation date-time: 06/06/2010 15:41 Requested date-time:  Receipt date-time:  Reported date-time:  Referring Physician:   Ordering Physician: Claudette Head [phone]) Specimen Source:  Source: EndoProS  Filler Order Number: [id number] Lab site:   Appended Document: Colonoscopy    Clinical Lists Changes  Observations: Added new observation of COLONNXTDUE: 06/2015 (06/06/2010 16:39)

## 2010-07-09 LAB — POCT I-STAT, CHEM 8
Chloride: 101 mEq/L (ref 96–112)
Creatinine, Ser: 0.9 mg/dL (ref 0.4–1.5)
Glucose, Bld: 92 mg/dL (ref 70–99)
HCT: 41 % (ref 39.0–52.0)
Hemoglobin: 13.9 g/dL (ref 13.0–17.0)
Potassium: 3.7 mEq/L (ref 3.5–5.1)
Sodium: 138 mEq/L (ref 135–145)

## 2010-07-09 LAB — POCT CARDIAC MARKERS
CKMB, poc: <1 ng/mL — ABNORMAL LOW (ref 1.0–8.0)
Myoglobin, poc: 59.4 ng/mL (ref 12–200)
Troponin i, poc: <0.05 ng/mL (ref 0.00–0.09)

## 2010-08-16 ENCOUNTER — Ambulatory Visit (INDEPENDENT_AMBULATORY_CARE_PROVIDER_SITE_OTHER): Payer: BC Managed Care – PPO | Admitting: Endocrinology

## 2010-08-16 ENCOUNTER — Encounter: Payer: Self-pay | Admitting: Endocrinology

## 2010-08-16 VITALS — BP 126/90 | HR 74 | Temp 98.7°F | Ht 70.0 in | Wt 172.0 lb

## 2010-08-16 DIAGNOSIS — IMO0002 Reserved for concepts with insufficient information to code with codable children: Secondary | ICD-10-CM

## 2010-08-16 DIAGNOSIS — S39012A Strain of muscle, fascia and tendon of lower back, initial encounter: Secondary | ICD-10-CM | POA: Insufficient documentation

## 2010-08-16 MED ORDER — METHOCARBAMOL 500 MG PO TABS: ORAL_TABLET | ORAL | Status: DC

## 2010-08-16 NOTE — Progress Notes (Signed)
  Subjective:    Patient ID: David Arias, male    DOB: 05/21/1955, 55 y.o.   MRN: [id number]  HPI 2 days ago he bent over at grocery store, but the basket he picked up was not very heavy.  Since then, 2 days of pain at the left lumbar paravertebral area.  Onset was sudden. Since then, there is slight improvement in the pain.  No assoc numbness.  Pain slightly radiates to the let aspect of the left thigh (but pt says this is questionable).    Review of Systems Denies rash and hematuria.      Objective:   Physical Exam GENERAL: no distress Back: nontender Gait: normal and steady.   Neuro:  Sensation is intact on the left thigh.        Assessment & Plan:  Back strain, new problem

## 2010-08-16 NOTE — Patient Instructions (Signed)
Continue "aleve," as needed Also, topical anti-inflammatory patches may help.   i have sent a prescription to your pharmacy for a muscle relaxant, to take at night. I hope you feel better soon.  If you don't feel better by next week, please call your doctor.

## 2010-08-19 NOTE — Op Note (Signed)
NAMESCHNEIDER, WARCHOL                             ACCOUNT NO.:  [id number]   MEDICAL RECORD NO.:  [id number]                   PATIENT TYPE:  AMB   LOCATION:  ENDO                                 [facility]ACILITY:  [facility]   PHYSICIAN:  Danise Edge, M.D.                DATE OF BIRTH:  16-Mar-1956   DATE OF PROCEDURE:  09/03/2003  DATE OF DISCHARGE:                                 OPERATIVE REPORT   PROCEDURE:  Colonoscopy.   REFERRED BY:  Thora Lance, M.D.   INDICATIONS FOR PROCEDURE:  Mr. Mylan Schwarz is a 55 year old male born 1955-06-13.  David Arias has intermittent chronic constipation associated with  hematochezia.  His mother was diagnosed with colon cancer at age 17.   ENDOSCOPIST:  Danise Edge, M.D.   PREMEDICATION:  Versed 8 mg, Demerol 50 mg.   DESCRIPTION OF PROCEDURE:  After obtaining informed consent, Mr. Johansson was  placed in the left lateral decubitus position. I administered intravenous  Demerol and intravenous Versed to achieve conscious sedation for the  procedure. The patient's blood pressure, oxygen saturation and cardiac  rhythm were monitored throughout the procedure and documented in the medical  record.   Anal inspection was normal and digital rectal exam was normal. The Olympus  adjustable pediatric colonoscope was introduced into the rectum and advanced  to the cecum.  A normal appearing ileocecal valve was intubated and the  distal ileum inspected.  Colonic preparation for the exam today was  excellent.   RECTUM:  Normal.   SIGMOID COLON AND DESCENDING COLON:  Normal.   SPLENIC FLEXURE:  Normal.   TRANSVERSE COLON:  Normal.   HEPATIC FLEXURE:  Normal.   ASCENDING COLON:  Normal.   CECUM AND ILEOCECAL VALVE:  Normal.   DISTAL ILEUM:  Normal.   ASSESSMENT:  Normal proctocolonoscopy to the cecum.   RECOMMENDATIONS:  Repeat colonoscopy in five years.                                               Danise Edge, M.D.    MJ/MEDQ  D:   09/03/2003  T:  09/03/2003  Job:  [id number]   cc:   Thora Lance, M.D.  [address]  [place]  [place] [address]  Fax: [phone]

## 2011-08-25 ENCOUNTER — Encounter: Payer: Self-pay | Admitting: Family Medicine

## 2011-08-25 ENCOUNTER — Ambulatory Visit (INDEPENDENT_AMBULATORY_CARE_PROVIDER_SITE_OTHER): Payer: BC Managed Care – PPO | Admitting: Family Medicine

## 2011-08-25 DIAGNOSIS — M109 Gout, unspecified: Secondary | ICD-10-CM

## 2011-08-25 MED ORDER — METHYLPREDNISOLONE ACETATE 80 MG/ML IJ SUSP
120.0000 mg | Freq: Once | INTRAMUSCULAR | Status: AC
  Administered 2011-08-25: 120 mg via INTRAMUSCULAR

## 2011-08-25 MED ORDER — ALLOPURINOL 100 MG PO TABS: 100.0000 mg | ORAL_TABLET | Freq: Every day | ORAL | Status: DC

## 2011-08-25 NOTE — Progress Notes (Signed)
Addended by: Aniceto Boss A on: 08/25/2011 04:43 PM   Modules accepted: Orders

## 2011-08-25 NOTE — Progress Notes (Signed)
  Subjective:    Patient ID: David Arias, male    DOB: [birth year], 56 y.o.   MRN: [id number]  HPI Here for 5 days of swelling and pain in the base of his right great toe. Using Advil, drinking fluids, and eating cherries. He has had numerous episodes like this in one great toe or the other, but they are usually mild and short lived. In August of 2012 he had a more painful episode like this in the left great to and he saw an Urgent Care center. They diagnosed gout, and his uric acid level was found to be 6.9. After they injected the toe with cortisone, it resolved.    Review of Systems  Constitutional: Negative.   Musculoskeletal: Positive for joint swelling and arthralgias.       Objective:   Physical Exam  Constitutional: He appears well-developed and well-nourished.  Musculoskeletal:       The MTP joint of the right great toe is swollen, red, warm, and very tender           Assessment & Plan:  Given a steroid shot. Try Allopurinol daily. When he has his cpx in August with Dr. Lovell Sheehan, we will check another uric acid level.

## 2011-11-17 ENCOUNTER — Other Ambulatory Visit (INDEPENDENT_AMBULATORY_CARE_PROVIDER_SITE_OTHER): Payer: BC Managed Care – PPO

## 2011-11-17 DIAGNOSIS — M109 Gout, unspecified: Secondary | ICD-10-CM

## 2011-11-17 DIAGNOSIS — Z0000 Encounter for general adult medical examination without abnormal findings: Secondary | ICD-10-CM

## 2011-11-17 LAB — CBC WITH DIFFERENTIAL/PLATELET
Basophils Absolute: 0.1 10*3/uL (ref 0.0–0.1)
Eosinophils Relative: 6 % — ABNORMAL HIGH (ref 0.0–5.0)
Lymphocytes Relative: 31 % (ref 12.0–46.0)
Monocytes Relative: 8.4 % (ref 3.0–12.0)
Neutrophils Relative %: 53.6 % (ref 43.0–77.0)
Platelets: 327 10*3/uL (ref 150.0–400.0)
RDW: 13.5 % (ref 11.5–14.6)
WBC: 7.3 10*3/uL (ref 4.5–10.5)

## 2011-11-17 LAB — LIPID PANEL
Cholesterol: 220 mg/dL — ABNORMAL HIGH (ref 0–200)
HDL: 50.9 mg/dL (ref >39.00)
Total CHOL/HDL Ratio: 4
Triglycerides: 99 mg/dL (ref 0.0–149.0)
VLDL: 19.8 mg/dL (ref 0.0–40.0)

## 2011-11-17 LAB — BASIC METABOLIC PANEL
BUN: 17 mg/dL (ref 6–23)
CO2: 29 mEq/L (ref 19–32)
Calcium: 9.6 mg/dL (ref 8.4–10.5)
Chloride: 105 mEq/L (ref 96–112)
Creatinine, Ser: 0.9 mg/dL (ref 0.4–1.5)
GFR: 94.86 mL/min (ref >60.00)
Glucose, Bld: 93 mg/dL (ref 70–99)
Potassium: 5.1 mEq/L (ref 3.5–5.1)
Sodium: 141 mEq/L (ref 135–145)

## 2011-11-17 LAB — HEPATIC FUNCTION PANEL
AST: 16 U/L (ref 0–37)
Alkaline Phosphatase: 67 U/L (ref 39–117)
Bilirubin, Direct: 0.1 mg/dL (ref 0.0–0.3)
Total Bilirubin: 0.6 mg/dL (ref 0.3–1.2)

## 2011-11-17 LAB — POCT URINALYSIS DIPSTICK
Glucose, UA: NEGATIVE
Leukocytes, UA: NEGATIVE
Nitrite, UA: NEGATIVE
Urobilinogen, UA: 0.2

## 2011-11-17 LAB — TSH: TSH: 0.61 u[IU]/mL (ref 0.35–5.50)

## 2011-11-20 NOTE — Progress Notes (Signed)
Quick Note:  I tried to reach pt by phone, no answer and mail box was full. ______

## 2011-11-24 ENCOUNTER — Encounter: Payer: Self-pay | Admitting: Internal Medicine

## 2011-11-24 ENCOUNTER — Ambulatory Visit (INDEPENDENT_AMBULATORY_CARE_PROVIDER_SITE_OTHER): Payer: BC Managed Care – PPO | Admitting: Internal Medicine

## 2011-11-24 VITALS — BP 110/70 | HR 72 | Temp 98.2°F | Resp 16 | Ht 70.0 in | Wt 169.0 lb

## 2011-11-24 DIAGNOSIS — Z0000 Encounter for general adult medical examination without abnormal findings: Secondary | ICD-10-CM

## 2011-11-24 NOTE — Progress Notes (Signed)
  Subjective:    Patient ID: David Arias, male    DOB: [birth year], 56 y.o.   MRN: [id number]  HPI CPX Hx of gout reviewed recent xrays   Review of Systems  Constitutional: Negative for fever and fatigue.  HENT: Negative for hearing loss, congestion, neck pain and postnasal drip.   Eyes: Negative for discharge, redness and visual disturbance.  Respiratory: Negative for cough, shortness of breath and wheezing.   Cardiovascular: Negative for leg swelling.  Gastrointestinal: Negative for abdominal pain, constipation and abdominal distention.  Genitourinary: Negative for urgency and frequency.  Musculoskeletal: Negative for joint swelling and arthralgias.  Skin: Negative for color change and rash.  Neurological: Negative for weakness and light-headedness.  Hematological: Negative for adenopathy.  Psychiatric/Behavioral: Negative for behavioral problems.   Past Medical History  Diagnosis Date  . ANEMIA-IRON DEFICIENCY 11/05/2007  . ALLERGIC RHINITIS 06/07/2007  . Irritable bowel syndrome 02/04/2010  . Cervical spondylosis without myelopathy 06/07/2007  . MYOFASCIAL PAIN SYNDROME 06/07/2007    History   Social History  . Marital Status: Divorced    Spouse Name: N/A    Number of Children: N/A  . Years of Education: N/A   Occupational History  . TECHNICIAN     engineer   Social History Main Topics  . Smoking status: Never Smoker   . Smokeless tobacco: Never Used  . Alcohol Use: No  . Drug Use: No  . Sexually Active: Not on file   Other Topics Concern  . Not on file   Social History Narrative  . No narrative on file    No past surgical history on file.  Family History  Problem Relation Age of Onset  . Cancer Mother 26    Colon Cancer  . Heart disease Father   . Hyperlipidemia Brother   . Hypertension Other     No Known Allergies  Current Outpatient Prescriptions on File Prior to Visit  Medication Sig Dispense Refill  . DISCONTD: allopurinol (ZYLOPRIM) 100 MG  tablet Take 1 tablet (100 mg total) by mouth daily.  90 tablet  0    BP 110/70  Pulse 72  Temp 98.2 F (36.8 C)  Resp 16  Ht 5\' 10"  (1.778 m)  Wt 169 lb (76.658 kg)  BMI 24.25 kg/m2       Objective:   Physical Exam  Constitutional: He is oriented to person, place, and time. He appears well-developed and well-nourished.  HENT:  Head: Normocephalic and atraumatic.  Eyes: Conjunctivae are normal. Pupils are equal, round, and reactive to light.  Neck: Normal range of motion. Neck supple.  Cardiovascular: Normal rate and regular rhythm.   Pulmonary/Chest: Effort normal and breath sounds normal.  Abdominal: Soft. Bowel sounds are normal.  Musculoskeletal: Normal range of motion.  Neurological: He is alert and oriented to person, place, and time.  Skin: Skin is warm and dry.          Assessment & Plan:   Patient presents for yearly preventative medicine examination.   all immunizations and health maintenance protocols were reviewed with the patient and they are up to date with these protocols.   screening laboratory values were reviewed with the patient including screening of hyperlipidemia PSA renal function and hepatic function.   There medications past medical history social history problem list and allergies were reviewed in detail.   Goals were established with regard to weight loss exercise diet in compliance with medications

## 2012-04-27 ENCOUNTER — Ambulatory Visit (INDEPENDENT_AMBULATORY_CARE_PROVIDER_SITE_OTHER): Payer: BC Managed Care – PPO | Admitting: Internal Medicine

## 2012-04-27 ENCOUNTER — Encounter: Payer: Self-pay | Admitting: Internal Medicine

## 2012-04-27 VITALS — BP 100/40 | Temp 97.8°F | Wt 173.0 lb

## 2012-04-27 DIAGNOSIS — IMO0002 Reserved for concepts with insufficient information to code with codable children: Secondary | ICD-10-CM

## 2012-04-27 DIAGNOSIS — L98499 Non-pressure chronic ulcer of skin of other sites with unspecified severity: Secondary | ICD-10-CM

## 2012-04-27 MED ORDER — PANTOPRAZOLE SODIUM 40 MG PO TBEC: 40.0000 mg | DELAYED_RELEASE_TABLET | Freq: Every day | ORAL | Status: DC

## 2012-04-27 MED ORDER — ESOMEPRAZOLE MAGNESIUM 40 MG PO PACK: 40.0000 mg | PACK | Freq: Every day | ORAL | Status: DC

## 2012-04-27 NOTE — Patient Instructions (Addendum)
Peptic Ulcers Ulcers are small, open craters or sores that develop in the lining of the stomach or the duodenum (the first part of the small intestine). The term peptic ulcer is used to describe both types of ulcers. There are a number of treatments that relieve the discomfort of ulcers. In most cases ulcers do heal.   CAUSES AND COMMON FEATURES OF PEPTIC ULCERS   Peptic ulcers occur only in areas of the digestive system that come in contact with digestive juices. These juices are secreted (given off) by the stomach. They include acid and an enzyme called pepsin that breaks down proteins. Many people with duodenal ulcers have too much digestive juice spilling down from the stomach. Most people with gastric (stomach) ulcers have normal or below normal amounts of stomach acid. Sometimes, when the mucous membrane (protective lining) of the stomach and duodenum does not protect well, this may add to the growth of ulcers. Duodenal ulcers often produces pain in a small area between the breastbone and navel. Pain varies from hunger pain to constant gnawing or burning sensations (feeling). Sometimes the pain is felt during sleep and may awaken the person in the middle of the night. Often the pain occurs two or three hours after eating, when the stomach is empty. Other common symptoms (problems) include overeating for pain relief. Eating relieves the pain of a duodenal ulcer. Gastric ulcer pain may be felt in the same place as the pain of a duodenal ulcer, or slightly higher up. There may also be sensations of feeling full, indigestion, and heartburn. Sometimes pain occurs when the stomach is full. This causes loss of appetite followed by weight loss. HOME CARE INSTRUCTIONS    Use of tobacco products have been found to slow down the healing of an ulcer. STOP SMOKING.   Avoid alcohol, aspirin, and other inflammation (swelling and soreness) reducing drugs. These substances weaken the stomach lining.   Eat regular,  nutritious meals.   Avoid foods that bother you.   Take medications and antacids as directed. Over-the-counter medications are used to neutralize stomach acid. Prescription medications reduce acid secretion, block acid production, or provide a protective coating over the ulcer. If a specific antacid was prescribed, do not switch brands without your caregiver's approval.  Surgery is usually not necessary. Diet and/or drug therapy usually is effective. Surgery may be necessary if perforation, obstruction due to scarring, or uncontrollable bleeding is found, or if severe pain is not otherwise controlled. SEEK IMMEDIATE MEDICAL CARE IF:  You see signs of bleeding. This includes vomiting fresh, bright red blood or passing bloody or tarry, black stools.   You suffer weakness, fatigue, or loss of consciousness. These symptoms can result from severe hemorrhaging (bleeding). Shock may result.   You have sudden, intense, severe abdominal (belly) pain. This is the first sign of a perforation. This would require immediate surgical treatment.   You have intense pain and continued vomiting. This could signal an obstruction of the digestive tract.  Document Released: 03/17/2000 Document Revised: 06/12/2011 Document Reviewed: 03/16/2008 Exit[employer] Patient Information 2013 [employer].    

## 2012-04-27 NOTE — Progress Notes (Signed)
Subjective:    Patient ID: David Arias, male    DOB: 1956-02-22, 57 y.o.   MRN: [id number]  HPI  Pt presents to the clinic today with c/o stomach pain. This started 4 days ago. It feels like a burning sensation that starts at his stomach and runs down to his belly button. He has had reflux in the past and states this feels different. He has not seen any blood in his stool. He has not had diarrhea. He has not taken anything for the pain in his stomach. He has not eaten any spicy foods or taken any new medications.  Review of Systems      Past Medical History  Diagnosis Date  . ANEMIA-IRON DEFICIENCY 11/05/2007  . ALLERGIC RHINITIS 06/07/2007  . Irritable bowel syndrome 02/04/2010  . Cervical spondylosis without myelopathy 06/07/2007  . MYOFASCIAL PAIN SYNDROME 06/07/2007    Current Outpatient Prescriptions  Medication Sig Dispense Refill  . allopurinol (ZYLOPRIM) 100 MG tablet Take 100 mg by mouth daily as needed.      Marland Kitchen esomeprazole (NEXIUM) 40 MG packet Take 40 mg by mouth daily before breakfast.  30 each  1    No Known Allergies  Family History  Problem Relation Age of Onset  . Cancer Mother 4    Colon Cancer  . Heart disease Father   . Hyperlipidemia Brother   . Hypertension Other     History   Social History  . Marital Status: Divorced    Spouse Name: N/A    Number of Children: N/A  . Years of Education: N/A   Occupational History  . TECHNICIAN     engineer   Social History Main Topics  . Smoking status: Never Smoker   . Smokeless tobacco: Never Used  . Alcohol Use: No  . Drug Use: No  . Sexually Active: Not on file   Other Topics Concern  . Not on file   Social History Narrative  . No narrative on file     Constitutional: Denies fever, malaise, fatigue, headache or abrupt weight changes.    Cardiovascular: Denies chest pain, chest tightness, palpitations or swelling in the hands or feet.  Gastrointestinal: Pt reports stomach pain. Denies bloating,  constipation, diarrhea or blood in the stool.  GU: Denies urgency, frequency, pain with urination, burning sensation, blood in urine, odor or discharge.  No other specific complaints in a complete review of systems (except as listed in HPI above).  Objective:   Physical Exam   BP 100/40  Temp 97.8 F (36.6 C) (Oral)  Wt 173 lb (78.472 kg) Wt Readings from Last 3 Encounters:  04/27/12 173 lb (78.472 kg)  11/24/11 169 lb (76.658 kg)  08/25/11 173 lb (78.472 kg)    General: Appears her stated age, well developed, well nourished in NAD. Cardiovascular: Normal rate and rhythm. S1,S2 noted.  No murmur, rubs or gallops noted. No JVD or BLE edema. No carotid bruits noted. Pulmonary/Chest: Normal effort and positive vesicular breath sounds. No respiratory distress. No wheezes, rales or ronchi noted.  Abdomen: Soft. Tender to deep palpation of the LUQ. Normal bowel sounds, no bruits noted. No distention or masses noted. Liver, spleen and kidneys non palpable.         Assessment & Plan:   Peptic Ulcer, new onset with additional workup required:  Will start Nexium 40 mg Daily Avoid spicy and acidic foods If problem persist, would like to get CBC, and H Pylori antibody  RTC as needed or if  symptoms persist

## 2012-04-27 NOTE — Addendum Note (Signed)
Addended by: Lorre Munroe on: 04/27/2012 11:03 AM   Modules accepted: Orders

## 2012-05-24 ENCOUNTER — Encounter: Payer: Self-pay | Admitting: Internal Medicine

## 2012-05-24 ENCOUNTER — Other Ambulatory Visit: Payer: Self-pay | Admitting: Internal Medicine

## 2012-05-24 ENCOUNTER — Ambulatory Visit (INDEPENDENT_AMBULATORY_CARE_PROVIDER_SITE_OTHER): Payer: BC Managed Care – PPO | Admitting: Internal Medicine

## 2012-05-24 ENCOUNTER — Ambulatory Visit: Payer: BC Managed Care – PPO | Admitting: Internal Medicine

## 2012-05-24 VITALS — BP 130/80 | HR 76 | Temp 98.2°F | Resp 16 | Ht 70.0 in | Wt 173.0 lb

## 2012-05-24 DIAGNOSIS — R1312 Dysphagia, oropharyngeal phase: Secondary | ICD-10-CM

## 2012-05-24 DIAGNOSIS — D239 Other benign neoplasm of skin, unspecified: Secondary | ICD-10-CM

## 2012-05-24 DIAGNOSIS — D229 Melanocytic nevi, unspecified: Secondary | ICD-10-CM

## 2012-05-24 DIAGNOSIS — M722 Plantar fascial fibromatosis: Secondary | ICD-10-CM

## 2012-05-24 DIAGNOSIS — K219 Gastro-esophageal reflux disease without esophagitis: Secondary | ICD-10-CM

## 2012-05-24 NOTE — Patient Instructions (Addendum)
Leave the dressing in place until you've showered. Place a dab of Neosporin and a new bandage until a scab appears over the biopsy site. After the scab appears there is no need to keep a dressing in place unless the area can be irritated by clothing. Call our office if redness around the biopsy site appears and begins to spread more than a half inch from the site.  

## 2012-05-24 NOTE — Progress Notes (Signed)
  Subjective:    Patient ID: David Arias, male    DOB: 1955-06-03, 57 y.o.   MRN: [id number]  HPIpatient presents today for suspicious mole to be removed from his mid back.  He states has been growing in size. It appears to be a dermatofibroma He also has acute complaints of dysphagia with reflux that has not responded to Protonix with a history of iron deficiency anemia.  He saw a doctor in our Saturday clinic who suggested that he might have early ulcer disease. He does have some symptoms of early satiety referral to GI for an EGD is warranted.  He also has recurrent plantar fasciitis worse on the right than the left we discussed plantar fasciitis options including an initial option of arch support orthotics and heavy stretching exercises as an initial intervention    Review of Systems  Constitutional: Negative for fever and fatigue.  HENT: Negative for hearing loss, congestion, neck pain and postnasal drip.   Eyes: Negative for discharge, redness and visual disturbance.  Respiratory: Negative for cough, shortness of breath and wheezing.   Cardiovascular: Negative for leg swelling.  Gastrointestinal: Negative for abdominal pain, constipation and abdominal distention.  Genitourinary: Negative for urgency and frequency.  Musculoskeletal: Negative for joint swelling and arthralgias.  Skin: Negative for color change and rash.  Neurological: Negative for weakness and light-headedness.  Hematological: Negative for adenopathy.  Psychiatric/Behavioral: Negative for behavioral problems.       Objective:   Physical Exam  Nursing note and vitals reviewed. HENT:  Head: Normocephalic and atraumatic.  Cardiovascular: Normal rate and regular rhythm.   Pulmonary/Chest: Effort normal and breath sounds normal.  Abdominal: Soft. Bowel sounds are normal.  Skin:  Enlarged lesion mid back   Tender plantar fascia right greater than left       Assessment & Plan:  Reflux esophagitis with  dysphagia that did not respond to proton pump inhibitor plan to refer to gastroenterology since he has a history of iron deficiency anemia possible risks for  PUD.  Plantar fasciitis we will avoid nonsteroidals and teach him stretching and use of orthotics.  Suspicious lesion on the back has been biopsied and sent for pathology Informed consent was given by the patient for a shave biopsy. The site was prepped with Betadine and using a 15 blade a 1 cm shave biopsy was obtained. The specimin was placed in preservative and sent for pathology. Hemostasis was achieved with a compression. Wound care was discussed with the patient. The patient was informed that it would be one to 2 weeks before the pathology will be interpreted.

## 2012-05-24 NOTE — Addendum Note (Signed)
Addended by: Willy Eddy on: 05/24/2012 05:49 PM   Modules accepted: Orders

## 2012-05-28 ENCOUNTER — Ambulatory Visit (INDEPENDENT_AMBULATORY_CARE_PROVIDER_SITE_OTHER): Payer: BC Managed Care – PPO | Admitting: Family Medicine

## 2012-05-28 ENCOUNTER — Encounter: Payer: Self-pay | Admitting: Family Medicine

## 2012-05-28 VITALS — BP 128/80 | HR 100 | Temp 99.0°F | Wt 178.0 lb

## 2012-05-28 DIAGNOSIS — M109 Gout, unspecified: Secondary | ICD-10-CM

## 2012-05-28 MED ORDER — METHYLPREDNISOLONE ACETATE 80 MG/ML IJ SUSP
120.0000 mg | Freq: Once | INTRAMUSCULAR | Status: AC
  Administered 2012-05-28: 120 mg via INTRAMUSCULAR

## 2012-05-28 NOTE — Progress Notes (Signed)
  Subjective:    Patient ID: David Arias, male    DOB: January 29, 1956, 57 y.o.   MRN: [id number]  HPI Here for several weeks of pain in the base of the left toe. He has gout and this is typical for him. His usual OTC measure of drinking apple cider vinegar has not helped. He also has plantar fasciitis but this is a separate issue. Taking Advil.    Review of Systems  Constitutional: Negative.   Musculoskeletal: Positive for arthralgias.       Objective:   Physical Exam  Constitutional: He appears well-developed and well-nourished.  Musculoskeletal:  The left great MTP is swollen and tender. Not red or hot           Assessment & Plan:  Given a steroid shot.

## 2012-05-28 NOTE — Addendum Note (Signed)
Addended by: Aniceto Boss A on: 05/28/2012 11:16 AM   Modules accepted: Orders

## 2012-06-10 ENCOUNTER — Encounter: Payer: Self-pay | Admitting: Gastroenterology

## 2012-06-10 ENCOUNTER — Ambulatory Visit (INDEPENDENT_AMBULATORY_CARE_PROVIDER_SITE_OTHER): Payer: BC Managed Care – PPO | Admitting: Gastroenterology

## 2012-06-10 VITALS — BP 132/70 | HR 88 | Ht 67.75 in | Wt 174.5 lb

## 2012-06-10 DIAGNOSIS — K219 Gastro-esophageal reflux disease without esophagitis: Secondary | ICD-10-CM

## 2012-06-10 DIAGNOSIS — R143 Flatulence: Secondary | ICD-10-CM

## 2012-06-10 DIAGNOSIS — R140 Abdominal distension (gaseous): Secondary | ICD-10-CM

## 2012-06-10 DIAGNOSIS — R141 Gas pain: Secondary | ICD-10-CM

## 2012-06-10 DIAGNOSIS — R1013 Epigastric pain: Secondary | ICD-10-CM

## 2012-06-10 DIAGNOSIS — Z800 Family history of malignant neoplasm of digestive organs: Secondary | ICD-10-CM

## 2012-06-10 NOTE — Progress Notes (Signed)
History of Present Illness: This is a 57 year old male who relates problems with diffuse chest and upper abdominal burning pain associated with gas, bloating, early satiety and belching for several weeks. He was treated with pantoprazole and his symptoms essentially resolved.Marland Kitchen He also relates a separate problem that has occurred in the past with lower abdominal discomfort and bloating. He previously underwent colonoscopy in March 2012 showing only hemorrhoids. Recent blood work was unremarkable except for elevated cholesterol. Denies weight loss, constipation, diarrhea, change in stool caliber, melena, hematochezia, nausea, vomiting, dysphagia.  Current Medications, Allergies, Past Medical History, Past Surgical History, Family History and Social History were reviewed in [employer] record.  Physical Exam: General: Well developed , well nourished, no acute distress Head: Normocephalic and atraumatic Eyes:  sclerae anicteric, EOMI Ears: Normal auditory acuity Mouth: No deformity or lesions Lungs: Clear throughout to auscultation Heart: Regular rate and rhythm; no murmurs, rubs or bruits Abdomen: Soft, non tender and non distended. No masses, hepatosplenomegaly or hernias noted. Normal Bowel sounds Rectal: deferred Musculoskeletal: Symmetrical with no gross deformities  Pulses:  Normal pulses noted Extremities: No clubbing, cyanosis, edema or deformities noted Neurological: Alert oriented x 4, grossly nonfocal Psychological:  Alert and cooperative. Normal mood and affect  Assessment and Recommendations:  1. Chest pain, upper abdominal pain, bloating, gas, early satiety-all symptoms resolving.. Suspected GERD and IBS. Rule out ulcer disease, gastritis, upper gastrointestinal tract neoplasms. Resume pantoprazole if symptoms recur. Schedule upper endoscopy. The risks, benefits, and alternatives to endoscopy with possible biopsy and possible dilation were discussed with the  patient and they consent to proceed.   2. Family history of colon cancer in his mother. Elevated risk screening colonoscopy recommended March 2017.

## 2012-06-10 NOTE — Patient Instructions (Addendum)
You have been scheduled for an endoscopy with propofol. Please follow written instructions given to you at your visit today. If you use inhalers (even only as needed), please bring them with you on the day of your procedure.  Patient advised to avoid spicy, acidic, citrus, chocolate, mints, fruit and fruit juices.  Limit the intake of caffeine, alcohol and Soda.  Don't exercise too soon after eating.  Don't lie down within 3-4 hours of eating.  Elevate the head of your bed.   Thank you for choosing me and [facility].  Malcolm T. Stark, Jr., MD., FACG  

## 2012-06-11 ENCOUNTER — Encounter: Payer: Self-pay | Admitting: Gastroenterology

## 2012-06-26 NOTE — Progress Notes (Signed)
Pt informed

## 2012-06-27 ENCOUNTER — Telehealth: Payer: Self-pay | Admitting: *Deleted

## 2012-06-27 ENCOUNTER — Encounter: Payer: Self-pay | Admitting: Gastroenterology

## 2012-06-27 ENCOUNTER — Ambulatory Visit (AMBULATORY_SURGERY_CENTER): Payer: BC Managed Care – PPO | Admitting: Gastroenterology

## 2012-06-27 VITALS — BP 132/83 | HR 63 | Temp 97.1°F | Resp 17 | Ht 67.75 in | Wt 174.0 lb

## 2012-06-27 DIAGNOSIS — R079 Chest pain, unspecified: Secondary | ICD-10-CM

## 2012-06-27 DIAGNOSIS — R142 Eructation: Secondary | ICD-10-CM

## 2012-06-27 DIAGNOSIS — K2990 Gastroduodenitis, unspecified, without bleeding: Secondary | ICD-10-CM

## 2012-06-27 DIAGNOSIS — R1013 Epigastric pain: Secondary | ICD-10-CM

## 2012-06-27 DIAGNOSIS — K2970 Gastritis, unspecified, without bleeding: Secondary | ICD-10-CM

## 2012-06-27 DIAGNOSIS — K219 Gastro-esophageal reflux disease without esophagitis: Secondary | ICD-10-CM

## 2012-06-27 DIAGNOSIS — R141 Gas pain: Secondary | ICD-10-CM

## 2012-06-27 MED ORDER — ESOMEPRAZOLE MAGNESIUM 40 MG PO CPDR: 40.0000 mg | DELAYED_RELEASE_CAPSULE | Freq: Every day | ORAL | Status: DC

## 2012-06-27 MED ORDER — PANTOPRAZOLE SODIUM 40 MG PO TBEC: 40.0000 mg | DELAYED_RELEASE_TABLET | Freq: Every day | ORAL | Status: DC

## 2012-06-27 MED ORDER — SODIUM CHLORIDE 0.9 % IV SOLN: 500.0000 mL | INTRAVENOUS | Status: DC

## 2012-06-27 NOTE — Patient Instructions (Addendum)

## 2012-06-27 NOTE — Op Note (Addendum)
[facility] [address] [place], [address]   ENDOSCOPY PROCEDURE REPORT  PATIENT: David Arias, David Arias  MR#: [id number] BIRTHDATE: 05/04/1955 , 56  yrs. old GENDER: Male ENDOSCOPIST: Meryl Dare, MD, [facility] PROCEDURE DATE:  06/27/2012 PROCEDURE:  EGD w/ biopsy ASA CLASS:     Class II INDICATIONS:  Chest pain.   Epigastric pain. MEDICATIONS: MAC sedation, administered by CRNA and propofol (Diprivan) 150mg  IV TOPICAL ANESTHETIC: Cetacaine Spray DESCRIPTION OF PROCEDURE: After the risks benefits and alternatives of the procedure were thoroughly explained, informed consent was obtained.  The LB GIF-H180 [id number] endoscope was introduced through the mouth and advanced to the second portion of the duodenum without limitations.  The instrument was slowly withdrawn as the mucosa was fully examined.  ESOPHAGUS: There was LA Class A erosive esophagitis noted.   The esophagus was otherwise normal. STOMACH: Mild erosive gastritis (inflammation) was found in the gastric body.   The stomach otherwise appeared normal. DUODENUM: The duodenal mucosa showed no abnormalities in the bulb and second portion of the duodenum.  Retroflexed views revealed no abnormalities.   The scope was then withdrawn from the patient and the procedure completed.  COMPLICATIONS: There were no complications.  ENDOSCOPIC IMPRESSION: 1.   LA Class A erosvie esophagitis 2.   Erosive gastritis (inflammation) in the body  RECOMMENDATIONS: 1.  Await pathology results 2.  Continue PPI long term 3.  Anti-reflux regimen long term    eSigned:  Meryl Dare, MD, [facility] 06/27/2012 8:46 AM

## 2012-06-27 NOTE — Telephone Encounter (Signed)
Patient called back and said he told pharmacy he wanted Nexium because Pantoprazole was giving him side effects.

## 2012-06-27 NOTE — Telephone Encounter (Signed)
Received fax from [facility] because they received a prescription for Nexium and Protonix, they are not sure which one they should fill.  Per Dr. Russella Dar office note on 06-10-12::::::::::::::::Resume pantoprazole if symptoms recur. Schedule upper endoscopy. The risks, benefits, and alternatives to endoscopy with possible biopsy and possible dilation were discussed with the patient and they consent to proceed.  Day of EGD patient had Nexium sent to pharmacy.  ______________________________________________________________________________________________  Per Ronny Bacon, CMA call patient and see which PPI therapy works best and tell pharmacy to fill that one.  Waiting on patient to call me back.

## 2012-06-27 NOTE — Progress Notes (Signed)
Patient did not experience any of the following events: a burn prior to discharge; a fall within the facility; wrong site/side/patient/procedure/implant event; or a hospital transfer or hospital admission upon discharge from the facility. (G8907) Patient did not have preoperative order for IV antibiotic SSI prophylaxis. (G8918)  

## 2012-06-28 ENCOUNTER — Telehealth: Payer: Self-pay | Admitting: *Deleted

## 2012-06-28 NOTE — Telephone Encounter (Signed)
Number identifier, left message, follow-up  

## 2012-07-02 ENCOUNTER — Encounter: Payer: Self-pay | Admitting: Gastroenterology

## 2012-07-31 ENCOUNTER — Other Ambulatory Visit: Payer: Self-pay

## 2012-07-31 DIAGNOSIS — R1013 Epigastric pain: Secondary | ICD-10-CM

## 2012-07-31 MED ORDER — ESOMEPRAZOLE MAGNESIUM 40 MG PO CPDR: 40.0000 mg | DELAYED_RELEASE_CAPSULE | Freq: Every day | ORAL | Status: DC

## 2012-08-27 ENCOUNTER — Telehealth: Payer: Self-pay | Admitting: Family Medicine

## 2012-08-27 MED ORDER — ALLOPURINOL 100 MG PO TABS: 100.0000 mg | ORAL_TABLET | Freq: Every day | ORAL | Status: DC

## 2012-08-27 NOTE — Telephone Encounter (Signed)
Refill request for Allopurinol 100 mg take 1 po qd and I did send script e-scribe.

## 2012-08-29 ENCOUNTER — Telehealth: Payer: Self-pay | Admitting: Gastroenterology

## 2012-08-29 DIAGNOSIS — R1013 Epigastric pain: Secondary | ICD-10-CM

## 2012-08-29 MED ORDER — ESOMEPRAZOLE MAGNESIUM 40 MG PO CPDR: 40.0000 mg | DELAYED_RELEASE_CAPSULE | Freq: Every day | ORAL | Status: DC

## 2012-08-29 NOTE — Telephone Encounter (Signed)
Prescription has been sent to [facility] for 90 day supply of Nexium. Pt notified.

## 2012-11-22 ENCOUNTER — Other Ambulatory Visit (INDEPENDENT_AMBULATORY_CARE_PROVIDER_SITE_OTHER): Payer: BC Managed Care – PPO

## 2012-11-22 DIAGNOSIS — Z0000 Encounter for general adult medical examination without abnormal findings: Secondary | ICD-10-CM

## 2012-11-22 LAB — CBC WITH DIFFERENTIAL/PLATELET
Basophils Absolute: 0.1 10*3/uL (ref 0.0–0.1)
Eosinophils Absolute: 0.4 10*3/uL (ref 0.0–0.7)
Lymphocytes Relative: 25.7 % (ref 12.0–46.0)
MCHC: 33.8 g/dL (ref 30.0–36.0)
Neutro Abs: 5.2 10*3/uL (ref 1.4–7.7)
Neutrophils Relative %: 60.9 % (ref 43.0–77.0)
Platelets: 356 10*3/uL (ref 150.0–400.0)
RDW: 13.7 % (ref 11.5–14.6)

## 2012-11-22 LAB — LIPID PANEL
Total CHOL/HDL Ratio: 5
Triglycerides: 83 mg/dL (ref 0.0–149.0)

## 2012-11-22 LAB — TSH: TSH: 0.87 u[IU]/mL (ref 0.35–5.50)

## 2012-11-22 LAB — PSA: PSA: 0.91 ng/mL (ref 0.10–4.00)

## 2012-11-22 LAB — POCT URINALYSIS DIPSTICK
Glucose, UA: NEGATIVE
Nitrite, UA: NEGATIVE
Protein, UA: NEGATIVE
Spec Grav, UA: 1.015
Urobilinogen, UA: 0.2
pH, UA: 7

## 2012-11-22 LAB — HEPATIC FUNCTION PANEL
ALT: 21 U/L (ref 0–53)
Albumin: 4 g/dL (ref 3.5–5.2)
Total Bilirubin: 0.6 mg/dL (ref 0.3–1.2)

## 2012-11-22 LAB — BASIC METABOLIC PANEL
BUN: 12 mg/dL (ref 6–23)
Chloride: 102 mEq/L (ref 96–112)
Creatinine, Ser: 1 mg/dL (ref 0.4–1.5)
Glucose, Bld: 97 mg/dL (ref 70–99)

## 2012-11-22 NOTE — Addendum Note (Signed)
Addended by: Bonnye Fava on: 11/22/2012 08:32 AM   Modules accepted: Orders

## 2012-11-29 ENCOUNTER — Ambulatory Visit (INDEPENDENT_AMBULATORY_CARE_PROVIDER_SITE_OTHER): Payer: BC Managed Care – PPO | Admitting: Internal Medicine

## 2012-11-29 ENCOUNTER — Encounter: Payer: Self-pay | Admitting: Internal Medicine

## 2012-11-29 VITALS — BP 120/78 | HR 72 | Temp 98.6°F | Resp 16 | Ht 67.75 in | Wt 172.0 lb

## 2012-11-29 DIAGNOSIS — E785 Hyperlipidemia, unspecified: Secondary | ICD-10-CM

## 2012-11-29 DIAGNOSIS — Z0000 Encounter for general adult medical examination without abnormal findings: Secondary | ICD-10-CM

## 2012-11-29 MED ORDER — ROSUVASTATIN CALCIUM 20 MG PO TABS: 20.0000 mg | ORAL_TABLET | Freq: Every day | ORAL | Status: DC

## 2012-11-29 NOTE — Patient Instructions (Addendum)
Gout  Gout is an inflammatory condition (arthritis) caused by a buildup of uric acid crystals in the joints. Uric acid is a chemical that is normally present in the blood. Under some circumstances, uric acid can form into crystals in your joints. This causes joint redness, soreness, and swelling (inflammation). Repeat attacks are common. Over time, uric acid crystals can form into masses (tophi) near a joint, causing disfigurement. Gout is treatable and often preventable.  CAUSES   The disease begins with elevated levels of uric acid in the blood. Uric acid is produced by your body when it breaks down a naturally found substance called purines. This also happens when you eat certain foods such as meats and fish. Causes of an elevated uric acid level include:   Being passed down from parent to child (heredity).   Diseases that cause increased uric acid production (obesity, psoriasis, some cancers).   Excessive alcohol use.   Diet, especially diets rich in meat and seafood.   Medicines, including certain cancer-fighting drugs (chemotherapy), diuretics, and aspirin.   Chronic kidney disease. The kidneys are no longer able to remove uric acid well.   Problems with metabolism.  Conditions strongly associated with gout include:   Obesity.   High blood pressure.   High cholesterol.   Diabetes.  Not everyone with elevated uric acid levels gets gout. It is not understood why some people get gout and others do not. Surgery, joint injury, and eating too much of certain foods are some of the factors that can lead to gout.  SYMPTOMS    An attack of gout comes on quickly. It causes intense pain with redness, swelling, and warmth in a joint.   Fever can occur.   Often, only one joint is involved. Certain joints are more commonly involved:   Base of the big toe.   Knee.   Ankle.   Wrist.   Finger.  Without treatment, an attack usually goes away in a few days to weeks. Between attacks, you usually will not have  symptoms, which is different from many other forms of arthritis.  DIAGNOSIS   Your caregiver will suspect gout based on your symptoms and exam. Removal of fluid from the joint (arthrocentesis) is done to check for uric acid crystals. Your caregiver will give you a medicine that numbs the area (local anesthetic) and use a needle to remove joint fluid for exam. Gout is confirmed when uric acid crystals are seen in joint fluid, using a special microscope. Sometimes, blood, urine, and X-ray tests are also used.  TREATMENT   There are 2 phases to gout treatment: treating the sudden onset (acute) attack and preventing attacks (prophylaxis).  Treatment of an Acute Attack   Medicines are used. These include anti-inflammatory medicines or steroid medicines.   An injection of steroid medicine into the affected joint is sometimes necessary.   The painful joint is rested. Movement can worsen the arthritis.   You may use warm or cold treatments on painful joints, depending which works best for you.   Discuss the use of coffee, vitamin C, or cherries with your caregiver. These may be helpful treatment options.  Treatment to Prevent Attacks  After the acute attack subsides, your caregiver may advise prophylactic medicine. These medicines either help your kidneys eliminate uric acid from your body or decrease your uric acid production. You may need to stay on these medicines for a very long time.  The early phase of treatment with prophylactic medicine can be associated   with an increase in acute gout attacks. For this reason, during the first few months of treatment, your caregiver may also advise you to take medicines usually used for acute gout treatment. Be sure you understand your caregiver's directions.  You should also discuss dietary treatment with your caregiver. Certain foods such as meats and fish can increase uric acid levels. Other foods such as dairy can decrease levels. Your caregiver can give you a list of foods  to avoid.  HOME CARE INSTRUCTIONS    Do not take aspirin to relieve pain. This raises uric acid levels.   Only take over-the-counter or prescription medicines for pain, discomfort, or fever as directed by your caregiver.   Rest the joint as much as possible. When in bed, keep sheets and blankets off painful areas.   Keep the affected joint raised (elevated).   Use crutches if the painful joint is in your leg.   Drink enough water and fluids to keep your urine clear or pale yellow. This helps your body get rid of uric acid. Do not drink alcoholic beverages. They slow the passage of uric acid.   Follow your caregiver's dietary instructions. Pay careful attention to the amount of protein you eat. Your daily diet should emphasize fruits, vegetables, whole grains, and fat-free or low-fat milk products.   Maintain a healthy body weight.  SEEK MEDICAL CARE IF:    You have an oral temperature above 102 F (38.9 C).   You develop diarrhea, vomiting, or any side effects from medicines.   You do not feel better in 24 hours, or you are getting worse.  SEEK IMMEDIATE MEDICAL CARE IF:    Your joint becomes suddenly more tender and you have:   Chills.   An oral temperature above 102 F (38.9 C), not controlled by medicine.  MAKE SURE YOU:    Understand these instructions.   Will watch your condition.   Will get help right away if you are not doing well or get worse.  Document Released: 03/17/2000 Document Revised: 06/12/2011 Document Reviewed: 06/28/2009  [employer] Patient Information 2014 [employer].

## 2012-11-29 NOTE — Progress Notes (Signed)
Subjective:    Patient ID: David Arias, male    DOB: 1955/07/08, 57 y.o.   MRN: [id number]  HPI Discussion of gout and prevention Responds to depo shots He is not interested in the use of allopurinol but wants to control with diet   relieve with nexium  Diagnosed with GERD    Review of Systems  Constitutional: Negative for fever and fatigue.  HENT: Negative for hearing loss, congestion, neck pain and postnasal drip.   Eyes: Negative for discharge, redness and visual disturbance.  Respiratory: Negative for cough, shortness of breath and wheezing.   Cardiovascular: Negative for leg swelling.  Gastrointestinal: Negative for abdominal pain, constipation and abdominal distention.  Genitourinary: Negative for urgency and frequency.  Musculoskeletal: Negative for joint swelling and arthralgias.  Skin: Negative for color change and rash.  Neurological: Negative for weakness and light-headedness.  Hematological: Negative for adenopathy.  Psychiatric/Behavioral: Negative for behavioral problems.   Past Medical History  Diagnosis Date  . ANEMIA-IRON DEFICIENCY 11/05/2007  . ALLERGIC RHINITIS 06/07/2007  . Irritable bowel syndrome 02/04/2010  . Cervical spondylosis without myelopathy 06/07/2007  . MYOFASCIAL PAIN SYNDROME 06/07/2007    History   Social History  . Marital Status: Divorced    Spouse Name: N/A    Number of Children: N/A  . Years of Education: N/A   Occupational History  . TECHNICIAN     engineer   Social History Main Topics  . Smoking status: Never Smoker   . Smokeless tobacco: Never Used  . Alcohol Use: No  . Drug Use: No  . Sexual Activity: Not on file   Other Topics Concern  . Not on file   Social History Narrative  . No narrative on file    No past surgical history on file.  Family History  Problem Relation Age of Onset  . Cancer Mother 56    Colon Cancer  . Heart disease Father   . Hyperlipidemia Brother   . Hypertension Other     No Known  Allergies  Current Outpatient Prescriptions on File Prior to Visit  Medication Sig Dispense Refill  . naproxen sodium (ANAPROX) 220 MG tablet Take 220 mg by mouth as needed.       No current facility-administered medications on file prior to visit.    BP 120/78  Pulse 72  Temp(Src) 98.6 F (37 C)  Resp 16  Ht 5' 7.75" (1.721 m)  Wt 172 lb (78.019 kg)  BMI 26.34 kg/m2       Objective:   Physical Exam  Nursing note and vitals reviewed. Constitutional: He is oriented to person, place, and time. He appears well-developed and well-nourished.  HENT:  Head: Normocephalic and atraumatic.  Eyes: Conjunctivae are normal. Pupils are equal, round, and reactive to light.  Neck: Normal range of motion. Neck supple.  Cardiovascular: Normal rate and regular rhythm.   Pulmonary/Chest: Effort normal and breath sounds normal.  Abdominal: Soft. Bowel sounds are normal.  Neurological: He is alert and oriented to person, place, and time.  Skin: Skin is warm and dry.  Psychiatric: He has a normal mood and affect. His behavior is normal.          Assessment & Plan:   Patient presents for yearly preventative medicine examination.   all immunizations and health maintenance protocols were reviewed with the patient and they are up to date with these protocols.   screening laboratory values were reviewed with the patient including screening of hyperlipidemia PSA renal function and hepatic  function.   There medications past medical history social history problem list and allergies were reviewed in detail.   Goals were established with regard to weight loss exercise diet in compliance with medications  GERD with nexium... Did not repond to the protonix due to joint pain Now using the otc strength.

## 2013-02-06 ENCOUNTER — Other Ambulatory Visit: Payer: Self-pay

## 2013-04-11 ENCOUNTER — Other Ambulatory Visit: Payer: BC Managed Care – PPO

## 2013-04-18 ENCOUNTER — Ambulatory Visit: Payer: BC Managed Care – PPO | Admitting: Internal Medicine

## 2013-07-25 ENCOUNTER — Other Ambulatory Visit (INDEPENDENT_AMBULATORY_CARE_PROVIDER_SITE_OTHER): Payer: BC Managed Care – PPO

## 2013-07-25 DIAGNOSIS — Z0000 Encounter for general adult medical examination without abnormal findings: Secondary | ICD-10-CM

## 2013-07-25 DIAGNOSIS — M109 Gout, unspecified: Secondary | ICD-10-CM

## 2013-07-25 DIAGNOSIS — E785 Hyperlipidemia, unspecified: Secondary | ICD-10-CM

## 2013-07-25 LAB — LIPID PANEL
CHOL/HDL RATIO: 6
Cholesterol: 253 mg/dL — ABNORMAL HIGH (ref 0–200)
HDL: 45.5 mg/dL (ref >39.00)
LDL Cholesterol: 179 mg/dL — ABNORMAL HIGH (ref 0–99)
TRIGLYCERIDES: 143 mg/dL (ref 0.0–149.0)
VLDL: 28.6 mg/dL (ref 0.0–40.0)

## 2013-07-25 LAB — HEPATIC FUNCTION PANEL
ALBUMIN: 4.3 g/dL (ref 3.5–5.2)
ALT: 26 U/L (ref 0–53)
AST: 23 U/L (ref 0–37)
Alkaline Phosphatase: 68 U/L (ref 39–117)
BILIRUBIN TOTAL: 0.8 mg/dL (ref 0.3–1.2)
Bilirubin, Direct: 0 mg/dL (ref 0.0–0.3)
Total Protein: 7.4 g/dL (ref 6.0–8.3)

## 2013-07-25 LAB — URIC ACID: Uric Acid, Serum: 8.9 mg/dL — ABNORMAL HIGH (ref 4.0–7.8)

## 2013-08-01 ENCOUNTER — Ambulatory Visit: Payer: BC Managed Care – PPO | Admitting: Internal Medicine

## 2013-08-08 ENCOUNTER — Ambulatory Visit (INDEPENDENT_AMBULATORY_CARE_PROVIDER_SITE_OTHER): Payer: BC Managed Care – PPO | Admitting: Internal Medicine

## 2013-08-08 ENCOUNTER — Telehealth: Payer: Self-pay | Admitting: Internal Medicine

## 2013-08-08 ENCOUNTER — Encounter: Payer: Self-pay | Admitting: Internal Medicine

## 2013-08-08 VITALS — BP 120/80 | HR 68 | Temp 98.0°F | Wt 181.5 lb

## 2013-08-08 DIAGNOSIS — T7840XA Allergy, unspecified, initial encounter: Secondary | ICD-10-CM

## 2013-08-08 DIAGNOSIS — E785 Hyperlipidemia, unspecified: Secondary | ICD-10-CM

## 2013-08-08 MED ORDER — ROSUVASTATIN CALCIUM 20 MG PO TABS: 20.0000 mg | ORAL_TABLET | Freq: Every day | ORAL | Status: DC

## 2013-08-08 NOTE — Telephone Encounter (Signed)
Sorry but I am too full  

## 2013-08-08 NOTE — Progress Notes (Signed)
Pre visit review using our clinic review tool, if applicable. No additional management support is needed unless otherwise documented below in the visit note. 

## 2013-08-08 NOTE — Patient Instructions (Signed)
Gout  Gout is when your joints become red, sore, and swell (inflammed). This is caused by the buildup of uric acid crystals in the joints. Uric acid is a chemical that is normally in the blood. If the level of uric acid gets too high in the blood, these crystals form in your joints and tissues. Over time, these crystals can form into masses near the joints and tissues. These masses can destroy bone and cause the bone to look misshapen (deformed).  HOME CARE   · Do not take aspirin for pain.  · Only take medicine as told by your doctor.  · Rest the joint as much as you can. When in bed, keep sheets and blankets off painful areas.  · Keep the sore joints raised (elevated).  · Put warm or cold packs on painful joints. Use of warm or cold packs depends on which works best for you.  · Use crutches if the painful joint is in your leg.  · Drink enough fluids to keep your pee (urine) clear or pale yellow. Limit alcohol, sugary drinks, and drinks with fructose in them.  · Follow your diet instructions. Pay careful attention to how much protein you eat. Include fruits, vegetables, whole grains, and fat-free or low-fat milk products in your daily diet. Talk to your doctor or dietician about the use of coffee, vitamin C, and cherries. These may help lower uric acid levels.  · Keep a healthy body weight.  GET HELP RIGHT AWAY IF:   · You have watery poop (diarrhea), throw up (vomit), or have any side effects from medicines.  · You do not feel better in 24 hours, or you are getting worse.  · Your joint becomes suddenly more tender, and you have chills or a fever.  MAKE SURE YOU:   · Understand these instructions.  · Will watch your condition.  · Will get help right away if you are not doing well or get worse.  Document Released: 12/28/2007 Document Revised: 07/15/2012 Document Reviewed: 06/28/2009  [employer]® Patient Information ©2014 [employer].

## 2013-08-08 NOTE — Progress Notes (Signed)
   Subjective:    Patient ID: David Arias, male    DOB: [birth year], 57 y.o.   MRN: [id number]  HPI  Increased gas and GI ditress and has associated with high levels of uric acid Had hives but started zyrtec and the symptoms have improved  Review of Systems  Constitutional: Negative for fever and fatigue.  HENT: Positive for postnasal drip, rhinorrhea and sinus pressure. Negative for congestion and hearing loss.   Eyes: Negative for discharge, redness and visual disturbance.  Respiratory: Negative for cough, shortness of breath and wheezing.   Cardiovascular: Negative for leg swelling.  Gastrointestinal: Negative for abdominal pain, constipation and abdominal distention.  Genitourinary: Negative for urgency and frequency.  Musculoskeletal: Positive for myalgias. Negative for joint swelling and neck pain.  Skin: Positive for rash. Negative for color change.  Neurological: Negative for weakness and light-headedness.  Hematological: Negative for adenopathy.  Psychiatric/Behavioral: Negative for behavioral problems.   Past Medical History  Diagnosis Date  . ANEMIA-IRON DEFICIENCY 11/05/2007  . ALLERGIC RHINITIS 06/07/2007  . Irritable bowel syndrome 02/04/2010  . Cervical spondylosis without myelopathy 06/07/2007  . MYOFASCIAL PAIN SYNDROME 06/07/2007    History   Social History  . Marital Status: Divorced    Spouse Name: N/A    Number of Children: N/A  . Years of Education: N/A   Occupational History  . TECHNICIAN     engineer   Social History Main Topics  . Smoking status: Never Smoker   . Smokeless tobacco: Never Used  . Alcohol Use: No  . Drug Use: No  . Sexual Activity: Not on file   Other Topics Concern  . Not on file   Social History Narrative  . No narrative on file    No past surgical history on file.  Family History  Problem Relation Age of Onset  . Cancer Mother 12    Colon Cancer  . Heart disease Father   . Hyperlipidemia Brother   . Hypertension Other       No Known Allergies  Current Outpatient Prescriptions on File Prior to Visit  Medication Sig Dispense Refill  . naproxen sodium (ANAPROX) 220 MG tablet Take 220 mg by mouth as needed.      . rosuvastatin (CRESTOR) 20 MG tablet Take 1 tablet (20 mg total) by mouth daily.  90 tablet  3   No current facility-administered medications on file prior to visit.    BP 120/80  Pulse 68  Temp(Src) 98 F (36.7 C) (Oral)  Wt 181 lb 8 oz (82.328 kg)       Objective:   Physical Exam  Constitutional: He appears well-developed and well-nourished.  HENT:  Head: Normocephalic and atraumatic.  Eyes: Conjunctivae are normal. Pupils are equal, round, and reactive to light.  Neck: Normal range of motion. Neck supple.  Cardiovascular: Normal rate and regular rhythm.   Pulmonary/Chest: Effort normal and breath sounds normal.  Abdominal: Soft. Bowel sounds are normal.  Skin: Rash noted.          Assessment & Plan:  Needs to get on crestor DID no want to treat the elevated uric acid...  allopurinol   Zyrtec for seasonal allergies

## 2013-08-08 NOTE — Telephone Encounter (Signed)
Pt has seen you in the past and would like to know if you will accept as a pt? Dr Arnoldo Morale recommend him see you  as he has seen you before.

## 2013-08-13 NOTE — Telephone Encounter (Signed)
No VM on cell phone and na.

## 2013-09-06 ENCOUNTER — Ambulatory Visit (INDEPENDENT_AMBULATORY_CARE_PROVIDER_SITE_OTHER): Payer: BC Managed Care – PPO | Admitting: Family Medicine

## 2013-09-06 ENCOUNTER — Encounter: Payer: Self-pay | Admitting: Family Medicine

## 2013-09-06 VITALS — BP 112/84 | HR 74 | Temp 98.4°F | Wt 181.0 lb

## 2013-09-06 DIAGNOSIS — J019 Acute sinusitis, unspecified: Secondary | ICD-10-CM

## 2013-09-06 MED ORDER — AZITHROMYCIN 250 MG PO TABS: ORAL_TABLET | ORAL | Status: DC

## 2013-09-06 NOTE — Progress Notes (Signed)
Pre visit review using our clinic review tool, if applicable. No additional management support is needed unless otherwise documented below in the visit note. 

## 2013-09-06 NOTE — Progress Notes (Signed)
[facility] [place], [place] Phone: [phone]  Patient ID: David Arias MRN: [id number], DOB: Dec 15, 1955, 58 y.o. Date of Encounter: 09/06/2013  Primary Physician:  Georgetta Haber, MD   Chief Complaint: Cough and Sore Throat  Subjective:   History of Present Illness:  David Arias is a 58 y.o. pleasant patient who presents with the following:  Cough x 2 weeks, hurting all over, throat is really sore and had to have soup for a couple of days.   + Exposure.  He has been getting worse, particularly over the last 2 days, more head congestion and some relatively mild pain. No fever, chills, or sweats.   Patient Active Problem List   Diagnosis Date Noted  . Gout 05/28/2012  . Back strain 08/16/2010  . CONSTIPATION 03/21/2010  . IRRITABLE BOWEL SYNDROME 02/04/2010  . URTICARIA 02/04/2010  . ABDOMINAL PAIN, LOWER 01/08/2010  . ABDOMINAL TENDERNESS, LEFT LOWER QUADRANT 07/06/2009  . ANEMIA-IRON DEFICIENCY 11/05/2007  . ALLERGIC RHINITIS 06/07/2007  . CERVICAL SPONDYLOSIS WITHOUT MYELOPATHY 06/07/2007  . MYOFASCIAL PAIN SYNDROME 06/07/2007   Past Medical History  Diagnosis Date  . ANEMIA-IRON DEFICIENCY 11/05/2007  . ALLERGIC RHINITIS 06/07/2007  . Irritable bowel syndrome 02/04/2010  . Cervical spondylosis without myelopathy 06/07/2007  . MYOFASCIAL PAIN SYNDROME 06/07/2007   No past surgical history on file. History   Social History  . Marital Status: Divorced    Spouse Name: N/A    Number of Children: N/A  . Years of Education: N/A   Occupational History  . TECHNICIAN     engineer   Social History Main Topics  . Smoking status: Never Smoker   . Smokeless tobacco: Never Used  . Alcohol Use: No  . Drug Use: No  . Sexual Activity: Not on file   Other Topics Concern  . Not on file   Social History Narrative  . No narrative on file   Family History  Problem Relation Age of Onset  .  Cancer Mother 8    Colon Cancer  . Heart disease Father   . Hyperlipidemia Brother   . Hypertension Other    No Known Allergies Medication list has been reviewed and updated.  Review of Systems:  GEN: No acute illnesses, no fevers, chills. GI: No n/v/d, eating normally Pulm: No SOB Interactive and getting along well at home.  Otherwise, ROS is as per the HPI.  Objective:   Physical Examination: BP 112/84  Pulse 74  Temp(Src) 98.4 F (36.9 C) (Oral)  Wt 181 lb (82.101 kg)  SpO2 97%   Gen: WDWN, NAD; alert,appropriate and cooperative throughout exam  HEENT: Normocephalic and atraumatic. Throat clear, w/o exudate, no LAD, R TM clear, L TM - good landmarks, No fluid present. rhinnorhea.  Left frontal and maxillary sinuses: Tender, mild Right frontal and maxillary sinuses: Tender, mild  Neck: No ant or post LAD CV: RRR, No M/G/R Pulm: Breathing comfortably in no resp distress. no w/c/r Abd: S,NT,ND,+BS Extr: no c/c/e Psych: full affect, pleasant   Laboratory and Imaging Data:  Assessment & Plan:   Acute sinusitis  May be prolonged viral infection. Flying to [place] in the morning. Hold ABX and fill if worsening or not clearing in next few days. Atypical such as mycoplasma also possible.  New Prescriptions   AZITHROMYCIN (ZITHROMAX Z-PAK) 250 MG TABLET    Take 2 tablets (500 mg) on  Day 1,  followed by 1 tablet (250 mg) once daily on  Days 2 through 5.   Modified Medications   No medications on file   No orders of the defined types were placed in this encounter.   Follow-up: No Follow-up on file. Unless noted above, the patient is to follow-up if symptoms worsen. Red flags were reviewed with the patient.  Signed,  Maud Deed. Jamie Hafford, MD, [facility]  Discontinued Medications   NAPROXEN SODIUM (ANAPROX) 220 MG TABLET    Take 220 mg by mouth as needed.   Current Medications at Discharge:   Medication  List       This list is accurate as of: 09/06/13  3:53 PM.  Always use your most recent med list.               azithromycin 250 MG tablet  Commonly known as:  ZITHROMAX Z-PAK  Take 2 tablets (500 mg) on  Day 1,  followed by 1 tablet (250 mg) once daily on Days 2 through 5.     rosuvastatin 20 MG tablet  Commonly known as:  CRESTOR  Take 1 tablet (20 mg total) by mouth daily.

## 2013-09-16 ENCOUNTER — Telehealth: Payer: Self-pay | Admitting: Internal Medicine

## 2013-09-16 NOTE — Telephone Encounter (Signed)
Pt was seen at the [place] office on 09/06/13, and was treated for sore throat, bad cough, pt was given an antibiotic, pt states his symptoms is not completely gone and the rx has ran out . Pt is needing a new rx for azithromycin (ZITHROMAX Z-PAK) 250 MG tablet sent to [facility] on [address].

## 2013-09-17 NOTE — Telephone Encounter (Signed)
Pt would like a callback when md ok meds

## 2013-09-18 NOTE — Telephone Encounter (Signed)
Hi, Alisha per Curt Bears can you please take care of this for the pt.

## 2013-09-18 NOTE — Telephone Encounter (Signed)
Put on Dr. Arnoldo Morale desk for review.

## 2013-09-19 MED ORDER — AZITHROMYCIN 250 MG PO TABS: ORAL_TABLET | ORAL | Status: DC

## 2013-09-19 NOTE — Telephone Encounter (Signed)
ok 

## 2013-09-19 NOTE — Telephone Encounter (Signed)
Rx sent to pharmacy.  Called and left a message for pt that rx was sent to pharmacy.

## 2013-10-10 ENCOUNTER — Telehealth: Payer: Self-pay | Admitting: Internal Medicine

## 2013-10-10 DIAGNOSIS — M1A0290 Idiopathic chronic gout, unspecified elbow, without tophus (tophi): Secondary | ICD-10-CM

## 2013-10-10 MED ORDER — ALLOPURINOL 100 MG PO TABS
100.0000 mg | ORAL_TABLET | Freq: Every day | ORAL | Status: DC
Start: 2013-10-10 — End: 2014-01-28

## 2013-10-10 NOTE — Telephone Encounter (Signed)
100 mg qhs per Dr Arnoldo Morale

## 2013-10-10 NOTE — Telephone Encounter (Signed)
Pt states dr Arnoldo Morale tried to get him to start taking allopurinol (ZYLOPRIM) 100 MG tablet at his last visit  But pt declined. But pt now would like to start this. [facility] [address]/ 90 day

## 2013-10-10 NOTE — Telephone Encounter (Signed)
Pt has been a long time pt of dr Arnoldo Morale and would like to know if you would mind accepting him as a pt?

## 2013-10-10 NOTE — Telephone Encounter (Signed)
Ok with me 

## 2013-10-10 NOTE — Telephone Encounter (Signed)
Appt in Sept.

## 2013-10-10 NOTE — Telephone Encounter (Signed)
rx sent in electronically 

## 2013-10-10 NOTE — Telephone Encounter (Signed)
Message copied by Biagio Borg on Fri Oct 10, 2013 12:25 PM ------      Message from: Jamesetta Orleans      Created: Fri Oct 10, 2013 11:57 AM      Regarding: FW: labs                   ----- Message -----         From: Sherral Hammers         Sent: 10/10/2013  11:54 AM           To: Donell Sievert Ewing      Subject: labs                                                     Physical labs for sept appt.  He wants uric acid added due to gout. ------

## 2013-12-05 ENCOUNTER — Other Ambulatory Visit (INDEPENDENT_AMBULATORY_CARE_PROVIDER_SITE_OTHER): Payer: BC Managed Care – PPO

## 2013-12-05 ENCOUNTER — Telehealth: Payer: Self-pay

## 2013-12-05 ENCOUNTER — Encounter: Payer: BC Managed Care – PPO | Admitting: Internal Medicine

## 2013-12-05 DIAGNOSIS — M1A0290 Idiopathic chronic gout, unspecified elbow, without tophus (tophi): Secondary | ICD-10-CM

## 2013-12-05 DIAGNOSIS — Z0000 Encounter for general adult medical examination without abnormal findings: Secondary | ICD-10-CM

## 2013-12-05 DIAGNOSIS — M1A00X0 Idiopathic chronic gout, unspecified site, without tophus (tophi): Secondary | ICD-10-CM

## 2013-12-05 LAB — HEPATIC FUNCTION PANEL
ALT: 28 U/L (ref 0–53)
AST: 19 U/L (ref 0–37)
Albumin: 4.1 g/dL (ref 3.5–5.2)
Alkaline Phosphatase: 90 U/L (ref 39–117)
BILIRUBIN TOTAL: 0.7 mg/dL (ref 0.2–1.2)
Bilirubin, Direct: 0.1 mg/dL (ref 0.0–0.3)
Total Protein: 7.5 g/dL (ref 6.0–8.3)

## 2013-12-05 LAB — LIPID PANEL
Cholesterol: 257 mg/dL — ABNORMAL HIGH (ref 0–200)
HDL: 42.2 mg/dL (ref >39.00)
NONHDL: 214.8
Total CHOL/HDL Ratio: 6
Triglycerides: 226 mg/dL — ABNORMAL HIGH (ref 0.0–149.0)
VLDL: 45.2 mg/dL — ABNORMAL HIGH (ref 0.0–40.0)

## 2013-12-05 LAB — CBC WITH DIFFERENTIAL/PLATELET
BASOS PCT: 0.4 % (ref 0.0–3.0)
Basophils Absolute: 0 10*3/uL (ref 0.0–0.1)
EOS ABS: 0.3 10*3/uL (ref 0.0–0.7)
Eosinophils Relative: 2.3 % (ref 0.0–5.0)
HCT: 44.2 % (ref 39.0–52.0)
HEMOGLOBIN: 15 g/dL (ref 13.0–17.0)
Lymphocytes Relative: 22.1 % (ref 12.0–46.0)
Lymphs Abs: 2.5 10*3/uL (ref 0.7–4.0)
MCHC: 33.8 g/dL (ref 30.0–36.0)
MCV: 83 fl (ref 78.0–100.0)
MONO ABS: 0.9 10*3/uL (ref 0.1–1.0)
Monocytes Relative: 8.4 % (ref 3.0–12.0)
NEUTROS ABS: 7.5 10*3/uL (ref 1.4–7.7)
NEUTROS PCT: 66.8 % (ref 43.0–77.0)
Platelets: 347 10*3/uL (ref 150.0–400.0)
RBC: 5.33 Mil/uL (ref 4.22–5.81)
RDW: 13.9 % (ref 11.5–15.5)
WBC: 11.2 10*3/uL — ABNORMAL HIGH (ref 4.0–10.5)

## 2013-12-05 LAB — URINALYSIS, ROUTINE W REFLEX MICROSCOPIC
Bilirubin Urine: NEGATIVE
Hgb urine dipstick: NEGATIVE
Ketones, ur: NEGATIVE
LEUKOCYTES UA: NEGATIVE
NITRITE: NEGATIVE
PH: 7 (ref 5.0–8.0)
Specific Gravity, Urine: 1.015 (ref 1.000–1.030)
Total Protein, Urine: NEGATIVE
Urine Glucose: NEGATIVE
Urobilinogen, UA: 0.2 (ref 0.0–1.0)

## 2013-12-05 LAB — BASIC METABOLIC PANEL
BUN: 13 mg/dL (ref 6–23)
CALCIUM: 9.6 mg/dL (ref 8.4–10.5)
CO2: 27 mEq/L (ref 19–32)
CREATININE: 1 mg/dL (ref 0.4–1.5)
Chloride: 102 mEq/L (ref 96–112)
GFR: 79.43 mL/min (ref >60.00)
Glucose, Bld: 90 mg/dL (ref 70–99)
Potassium: 4.2 mEq/L (ref 3.5–5.1)
Sodium: 136 mEq/L (ref 135–145)

## 2013-12-05 LAB — PSA: PSA: 0.99 ng/mL (ref 0.10–4.00)

## 2013-12-05 LAB — LDL CHOLESTEROL, DIRECT: Direct LDL: 192.5 mg/dL

## 2013-12-05 LAB — TSH: TSH: 0.85 u[IU]/mL (ref 0.35–4.50)

## 2013-12-05 LAB — URIC ACID: Uric Acid, Serum: 8.8 mg/dL — ABNORMAL HIGH (ref 4.0–7.8)

## 2013-12-05 NOTE — Telephone Encounter (Signed)
Labs entered.

## 2013-12-12 ENCOUNTER — Encounter: Payer: Self-pay | Admitting: Internal Medicine

## 2013-12-12 ENCOUNTER — Ambulatory Visit (INDEPENDENT_AMBULATORY_CARE_PROVIDER_SITE_OTHER): Payer: BC Managed Care – PPO | Admitting: Internal Medicine

## 2013-12-12 VITALS — BP 132/70 | HR 99 | Temp 98.3°F | Ht 68.0 in | Wt 174.3 lb

## 2013-12-12 DIAGNOSIS — Z0000 Encounter for general adult medical examination without abnormal findings: Secondary | ICD-10-CM

## 2013-12-12 DIAGNOSIS — E785 Hyperlipidemia, unspecified: Secondary | ICD-10-CM

## 2013-12-12 MED ORDER — ASPIRIN EC 81 MG PO TBEC: 81.0000 mg | DELAYED_RELEASE_TABLET | Freq: Every day | ORAL | Status: DC

## 2013-12-12 MED ORDER — ATORVASTATIN CALCIUM 20 MG PO TABS: 20.0000 mg | ORAL_TABLET | Freq: Every day | ORAL | Status: DC

## 2013-12-12 NOTE — Patient Instructions (Signed)
Please take all new medication as prescribed - lipitor 20 mg per day  Please start the Aspirin 81 mg - 1 per day - Coated  Please continue all other medications as before, and refills have been done if requested.  Please have the pharmacy call with any other refills you may need.  Please continue your efforts at being more active, low cholesterol diet, and weight control.  You are otherwise up to date with prevention measures today.  Please keep your appointments with your specialists as you may have planned  Please return in 1 year for your yearly visit, or sooner if needed, with Lab testing done 3-5 days before

## 2013-12-12 NOTE — Progress Notes (Signed)
Pre visit review using our clinic review tool, if applicable. No additional management support is needed unless otherwise documented below in the visit note. 

## 2013-12-12 NOTE — Progress Notes (Signed)
Subjective:    Patient ID: David Arias, male    DOB: 1955-08-07, 58 y.o.   MRN: [id number]  HPI  Here for wellness and f/u;  Overall doing ok;  Pt denies CP, worsening SOB, DOE, wheezing, orthopnea, PND, worsening LE edema, palpitations, dizziness or syncope.  Pt denies neurological change such as new headache, facial or extremity weakness.  Pt denies polydipsia, polyuria, or low sugar symptoms. Pt states overall good compliance with treatment and medications, good tolerability, and has been trying to follow lower cholesterol diet.  Pt denies worsening depressive symptoms, suicidal ideation or panic. No fever, night sweats, wt loss, loss of appetite, or other constitutional symptoms.  Pt states good ability with ADL's, has low fall risk, home safety reviewed and adequate, no other significant changes in hearing or vision, and occasionally active with exercise, such as rapelling for 200 ft waterfall recently. Declines flu shot. Not taking the allopurinol recently.  Only taking the statin intermittently with joint pain, lack of grip strength, hard to open jars, finally stopped and felt better. Past Medical History  Diagnosis Date  . ANEMIA-IRON DEFICIENCY 11/05/2007  . ALLERGIC RHINITIS 06/07/2007  . Irritable bowel syndrome 02/04/2010  . Cervical spondylosis without myelopathy 06/07/2007  . MYOFASCIAL PAIN SYNDROME 06/07/2007   No past surgical history on file.  reports that he has never smoked. He has never used smokeless tobacco. He reports that he does not drink alcohol or use illicit drugs. family history includes Cancer (age of onset: 18) in his mother; Heart disease in his father; Hyperlipidemia in his brother; Hypertension in his other. Allergies  Allergen Reactions  . Crestor [Rosuvastatin]     Joint pain   Current Outpatient Prescriptions on File Prior to Visit  Medication Sig Dispense Refill  . allopurinol (ZYLOPRIM) 100 MG tablet Take 1 tablet (100 mg total) by mouth at bedtime.  90 tablet   1   No current facility-administered medications on file prior to visit.     Review of Systems Constitutional: Negative for increased diaphoresis, other activity, appetite or other siginficant weight change  HENT: Negative for worsening hearing loss, ear pain, facial swelling, mouth sores and neck stiffness.   Eyes: Negative for other worsening pain, redness or visual disturbance.  Respiratory: Negative for shortness of breath and wheezing.   Cardiovascular: Negative for chest pain and palpitations.  Gastrointestinal: Negative for diarrhea, blood in stool, abdominal distention or other pain Genitourinary: Negative for hematuria, flank pain or change in urine volume.  Musculoskeletal: Negative for myalgias or other joint complaints.  Skin: Negative for color change and wound.  Neurological: Negative for syncope and numbness. other than noted Hematological: Negative for adenopathy. or other swelling Psychiatric/Behavioral: Negative for hallucinations, self-injury, decreased concentration or other worsening agitation.      Objective:   Physical Exam BP 132/70  Pulse 99  Temp(Src) 98.3 F (36.8 C) (Oral)  Ht 5\' 8"  (1.727 m)  Wt 174 lb 5 oz (79.068 kg)  BMI 26.51 kg/m2  SpO2 97% VS noted,  Constitutional: Pt is oriented to person, place, and time. Appears well-developed and well-nourished.  Head: Normocephalic and atraumatic.  Right Ear: External ear normal.  Left Ear: External ear normal.  Nose: Nose normal.  Mouth/Throat: Oropharynx is clear and moist.  Eyes: Conjunctivae and EOM are normal. Pupils are equal, round, and reactive to light.  Neck: Normal range of motion. Neck supple. No JVD present. No tracheal deviation present.  Cardiovascular: Normal rate, regular rhythm, normal heart sounds and intact  distal pulses.   Pulmonary/Chest: Effort normal and breath sounds without rales or wheezing  Abdominal: Soft. Bowel sounds are normal. NT. No HSM  Musculoskeletal: Normal range  of motion. Exhibits no edema.  Lymphadenopathy:  Has no cervical adenopathy.  Neurological: Pt is alert and oriented to person, place, and time. Pt has normal reflexes. No cranial nerve deficit. Motor grossly intact Skin: Skin is warm and dry. No rash noted.  Psychiatric:  Has normal mood and affect. Behavior is normal.     Assessment & Plan:

## 2013-12-13 ENCOUNTER — Encounter: Payer: Self-pay | Admitting: Internal Medicine

## 2013-12-13 DIAGNOSIS — E785 Hyperlipidemia, unspecified: Secondary | ICD-10-CM | POA: Insufficient documentation

## 2013-12-13 DIAGNOSIS — Z0000 Encounter for general adult medical examination without abnormal findings: Secondary | ICD-10-CM | POA: Insufficient documentation

## 2013-12-13 HISTORY — DX: Hyperlipidemia, unspecified: E78.5

## 2013-12-13 NOTE — Assessment & Plan Note (Signed)
For lipitor start, lower chol diet, start asa 81 qd

## 2013-12-13 NOTE — Assessment & Plan Note (Signed)

## 2014-01-28 ENCOUNTER — Telehealth: Payer: Self-pay | Admitting: Family

## 2014-01-28 ENCOUNTER — Encounter: Payer: Self-pay | Admitting: Internal Medicine

## 2014-01-28 ENCOUNTER — Ambulatory Visit (INDEPENDENT_AMBULATORY_CARE_PROVIDER_SITE_OTHER): Payer: BC Managed Care – PPO | Admitting: Internal Medicine

## 2014-01-28 VITALS — BP 120/82 | HR 79 | Temp 98.2°F | Ht 68.0 in | Wt 183.0 lb

## 2014-01-28 DIAGNOSIS — M109 Gout, unspecified: Secondary | ICD-10-CM

## 2014-01-28 DIAGNOSIS — E785 Hyperlipidemia, unspecified: Secondary | ICD-10-CM

## 2014-01-28 DIAGNOSIS — M1009 Idiopathic gout, multiple sites: Secondary | ICD-10-CM

## 2014-01-28 MED ORDER — ALLOPURINOL 100 MG PO TABS: 100.0000 mg | ORAL_TABLET | Freq: Every day | ORAL | Status: DC

## 2014-01-28 MED ORDER — PREDNISONE 10 MG PO TABS: ORAL_TABLET | ORAL | Status: DC

## 2014-01-28 MED ORDER — INDOMETHACIN 50 MG PO CAPS: 50.0000 mg | ORAL_CAPSULE | Freq: Three times a day (TID) | ORAL | Status: DC | PRN

## 2014-01-28 MED ORDER — METHYLPREDNISOLONE ACETATE 80 MG/ML IJ SUSP
120.0000 mg | Freq: Once | INTRAMUSCULAR | Status: AC
  Administered 2014-01-28: 120 mg via INTRAMUSCULAR

## 2014-01-28 NOTE — Progress Notes (Signed)
Pre visit review using our clinic review tool, if applicable. No additional management support is needed unless otherwise documented below in the visit note. 

## 2014-01-28 NOTE — Patient Instructions (Signed)
You had the steroid shot today  Please take all new medication as prescribed - the prednisone  You should plan to re-start taking the allopurinol starting Mon Nov 2  You can also take the Indocin as needed for future small attacks as needed  Please continue all other medications as before  Please have the pharmacy call with any other refills you may need.  Please keep your appointments with your specialists as you may have planned

## 2014-01-28 NOTE — Telephone Encounter (Signed)
Found opening with David Arias

## 2014-01-28 NOTE — Progress Notes (Signed)
   Subjective:    Patient ID: David Arias, male    DOB: 12-06-1955, 58 y.o.   MRN: [id number]  HPI  Here to f/u with c/o sharp gouty attacks to both feet right > left x 1 wk, mod to severe, constant, only mildly better with otc med such as tylenol and advil.   Not better or worse with anything else.  Has not been taking his allopurinol in the past month.  Also not yet started the Lipitor as per last visit, not clear why, but plans to start soon.  Pt denies chest pain, increased sob or doe, wheezing, orthopnea, PND, increased LE swelling, palpitations, dizziness or syncope.  Pt denies new neurological symptoms such as new headache, or facial or extremity weakness or numbness.  Pt denies polydipsia, polyuria,   Past Medical History  Diagnosis Date  . ANEMIA-IRON DEFICIENCY 11/05/2007  . ALLERGIC RHINITIS 06/07/2007  . Irritable bowel syndrome 02/04/2010  . Cervical spondylosis without myelopathy 06/07/2007  . MYOFASCIAL PAIN SYNDROME 06/07/2007  . Hyperlipidemia 12/13/2013   No past surgical history on file.  reports that he has never smoked. He has never used smokeless tobacco. He reports that he does not drink alcohol or use illicit drugs. family history includes Cancer (age of onset: 44) in his mother; Heart disease in his father; Hyperlipidemia in his brother; Hypertension in his other. Allergies  Allergen Reactions  . Crestor [Rosuvastatin]     Joint pain   Current Outpatient Prescriptions on File Prior to Visit  Medication Sig Dispense Refill  . aspirin EC 81 MG tablet Take 1 tablet (81 mg total) by mouth daily.  90 tablet  11  . atorvastatin (LIPITOR) 20 MG tablet Take 1 tablet (20 mg total) by mouth daily.  90 tablet  3   No current facility-administered medications on file prior to visit.   Review of Systems  Constitutional: Negative for unusual diaphoresis or other sweats  HENT: Negative for ringing in ear Eyes: Negative for double vision or worsening visual disturbance.  Respiratory:  Negative for choking and stridor.   Gastrointestinal: Negative for vomiting or other signifcant bowel change Genitourinary: Negative for hematuria or decreased urine volume.  Musculoskeletal: Negative for other MSK pain or swelling Skin: Negative for color change and worsening wound.  Neurological: Negative for tremors and numbness other than noted  Psychiatric/Behavioral: Negative for decreased concentration or agitation other than above       Objective:   Physical Exam BP 120/82  Pulse 79  Temp(Src) 98.2 F (36.8 C) (Oral)  Ht 5\' 8"  (1.727 m)  Wt 183 lb (83.008 kg)  BMI 27.83 kg/m2  SpO2 97% VS noted,  Constitutional: Pt appears well-developed, well-nourished.  HENT: Head: NCAT.  Right Ear: External ear normal.  Left Ear: External ear normal.  Eyes: . Pupils are equal, round, and reactive to light. Conjunctivae and EOM are normal Neck: Normal range of motion. Neck supple.  Cardiovascular: Normal rate and regular rhythm.   Pulmonary/Chest: Effort normal and breath sounds normal.  Abd:  Soft, NT, ND, + BS Neurological: Pt is alert. Not confused , motor grossly intact Skin: Skin is warm. No rash Bilat first MTP's 1-2+ red/tender/swelling Psychiatric: Pt behavior is normal. No agitation.     Assessment & Plan:

## 2014-01-28 NOTE — Telephone Encounter (Signed)
[facility] said to ask Dr. Tamala Julian. He doesn't do injections in the knee right now.

## 2014-01-28 NOTE — Telephone Encounter (Signed)
Patient is requesting to come in to get a steroid injection in his knee for gout.  He is a David Arias patient.  He last got this injection with Arnoldo Morale.  He is requesting we look back in his chart about a 1 1/2 ago to see what injection was given.  Is this something [facility] can do today?

## 2014-01-30 NOTE — Assessment & Plan Note (Addendum)
Moderate to severe bilat first MTP's , for depomedrol IM, predpac asd, tylenol prn, as well as re-start allopurinol in 5 days and indocin prn,  to f/u any worsening symptoms or concerns

## 2014-01-30 NOTE — Assessment & Plan Note (Signed)
Mild to mod uncontrolled, d.w pt, for lower chol diet, ok to start the lipitor, f/u lab next visit,  to f/u any worsening symptoms or concerns

## 2014-02-03 ENCOUNTER — Other Ambulatory Visit (INDEPENDENT_AMBULATORY_CARE_PROVIDER_SITE_OTHER): Payer: BC Managed Care – PPO

## 2014-02-03 ENCOUNTER — Ambulatory Visit (INDEPENDENT_AMBULATORY_CARE_PROVIDER_SITE_OTHER): Payer: BC Managed Care – PPO | Admitting: Family Medicine

## 2014-02-03 ENCOUNTER — Encounter: Payer: Self-pay | Admitting: Family Medicine

## 2014-02-03 ENCOUNTER — Encounter: Payer: Self-pay | Admitting: *Deleted

## 2014-02-03 VITALS — BP 146/80 | HR 84 | Ht 68.0 in | Wt 179.0 lb

## 2014-02-03 DIAGNOSIS — M7662 Achilles tendinitis, left leg: Secondary | ICD-10-CM

## 2014-02-03 DIAGNOSIS — M79672 Pain in left foot: Secondary | ICD-10-CM

## 2014-02-03 NOTE — Assessment & Plan Note (Signed)
Patient is able to ambulate secondary to the pain. I do not see any signs of gouty deposits but does appear to have a chronic Achilles tendinosis with a new partial tear of the Achilles tendon. Patient was put in a Banker, we discussed icing protocol, topical anti-inflammatories given, and home exercises will be started in approximately 1 week. Patient will do seated work only at work and then come back and see me again in 2 weeks and we will hopefully advance accordingly.

## 2014-02-03 NOTE — Patient Instructions (Signed)
You have a achilles tear causing the pain.  Ice bath 20 minutes 2 times a day  Pennsaid twice daily for next week then as needed Exercises starting Monday and 3 times a week Wear boot with any walking  Seated work only for 2 weeks See you again in 2 weeks.

## 2014-02-03 NOTE — Progress Notes (Signed)
[facility] [address], [place] [address] Phone: [phone] Subjective:    I'm seeing this patient by the request  of:  Cathlean Cower, MD  CC: left heel pain  [id number] David Arias is a 58 y.o. male coming in with complaint of left heel pain. Patient does have a past pedicle history significant for gout and is on indomethacin as well as allopurinol. Patient states  That this started hurting approximately 1 week ago after walking a significant amount of time. Patient has had a history of gout in the big toes and status post injection fairly recently he states. Patient states though that this is in the posterior aspect of his heel and worse with any type of ambulation. Has a dull throbbing pain even when he is not stepping on it. States that there is some inflammation. Does not rib or any true injury. States that he can wake him up at night. Severity is 9 out of 10 and is unable to ambulate secondary to the pain. Has never had pain like this previously.     Past medical history, social, surgical and family history all reviewed in electronic medical record.   Review of Systems: No headache, visual changes, nausea, vomiting, diarrhea, constipation, dizziness, abdominal pain, skin rash, fevers, chills, night sweats, weight loss, swollen lymph nodes, body aches, joint swelling, muscle aches, chest pain, shortness of breath, mood changes.   Objective Blood pressure 146/80, pulse 84, weight 179 lb (81.194 kg), SpO2 97 %.  General: No apparent distress alert and oriented x3 mood and affect normal, dressed appropriately.  HEENT: Pupils equal, extraocular movements intact  Respiratory: Patient's speak in full sentences and does not appear short of breath  Cardiovascular: No lower extremity edema, non tender, no erythema  Skin: Warm dry intact with no signs of infection or rash on extremities or on axial skeleton.  Abdomen: Soft nontender  Neuro: Cranial  nerves II through XII are intact, neurovascularly intact in all extremities with 2+ DTRs and 2+ pulses.  Lymph: No lymphadenopathy of posterior or anterior cervical chain or axillae bilaterally.  Gait antalgic gait MSK:  Non tender with full range of motion and good stability and symmetric strength and tone of shoulders, elbows, wrist, hip, knees bilaterally.   Ankle:left ankle Patient does have swelling as well as a Haglund's nodule. Range of motion is full in all directions. Strength is 5/5 in all directions. Stable lateral and medial ligaments; squeeze test and kleiger test unremarkable; Talar dome nontender; No pain at base of 5th MT; No tenderness over cuboid; No tenderness over N spot or navicular prominence No tenderness on posterior aspects of lateral and medial malleolus No sign of peroneal tendon subluxations or tenderness to palpation Negative tarsal tunnel tinel's Able to walk 4 steps but very painful with antalgic gait Patient is severely tender over the distal Achilles at its insertion. Contralateral ankle has no erythema but does have Haglund's nodule. Otherwise unremarkable.  MSK US performed of: left ankle This study was ordered, performed, and interpreted by Charlann Boxer D.O.  Foot/Ankle:   All structures visualized.   Talar dome unremarkable  Ankle mortise without effusion. Peroneus longus and brevis tendons unremarkable on long and transverse views without sheath effusions. Posterior tibialis, flexor hallucis longus, and flexor digitorum longus tendons unremarkable on long and transverse views without sheath effusions. Achilles tendon visualized skin shows the patient does have a partial tear at its insertion. Significant hypoechoic changes as well as calcific  changes noted. Posterior heel spur noted. Increasing Doppler flow noted. Anterior Talofibular Ligament and Calcaneofibular Ligaments unremarkable and intact. Deltoid Ligament unremarkable and intact. Plantar  fascia intact and without effusion, normal thickness. No increased doppler signal, cap sign, or thickening of tibial cortex.  IMPRESSION:  Partial Achilles tear with chronic Achilles tendinosis with calcific changes. No signs of gouty deposition.      Impression and Recommendations:     This case required medical decision making of moderate complexity.

## 2014-02-17 ENCOUNTER — Encounter: Payer: Self-pay | Admitting: *Deleted

## 2014-02-17 ENCOUNTER — Other Ambulatory Visit (INDEPENDENT_AMBULATORY_CARE_PROVIDER_SITE_OTHER): Payer: BC Managed Care – PPO

## 2014-02-17 ENCOUNTER — Ambulatory Visit (INDEPENDENT_AMBULATORY_CARE_PROVIDER_SITE_OTHER): Payer: BC Managed Care – PPO | Admitting: Family Medicine

## 2014-02-17 ENCOUNTER — Encounter: Payer: Self-pay | Admitting: Family Medicine

## 2014-02-17 VITALS — BP 130/74 | HR 89 | Ht 68.0 in | Wt 179.0 lb

## 2014-02-17 DIAGNOSIS — M7662 Achilles tendinitis, left leg: Secondary | ICD-10-CM

## 2014-02-17 NOTE — Progress Notes (Signed)
[facility] [address], [place] [address] Phone: [phone] Subjective:    CC: left heel pain follow up  [id number] David Arias is a 58 y.o. male coming in with complaint of left heel pain.  Patient was seen previously and did have a partial tearof the Achilles. Patient states that he is doing significantly better. Patient states that he is ambulating in the [employer] without any pain. Patient has not been doing any climbing and states that this has helped significantly. Patient does come out of the boot daily and do some range of motion exercises. Denies any new symptoms.     Past medical history, social, surgical and family history all reviewed in electronic medical record.   Review of Systems: No headache, visual changes, nausea, vomiting, diarrhea, constipation, dizziness, abdominal pain, skin rash, fevers, chills, night sweats, weight loss, swollen lymph nodes, body aches, joint swelling, muscle aches, chest pain, shortness of breath, mood changes.   Objective Blood pressure 130/74, pulse 89, height 5\' 8"  (1.727 m), weight 179 lb (81.194 kg), SpO2 98 %.  General: No apparent distress alert and oriented x3 mood and affect normal, dressed appropriately.  HEENT: Pupils equal, extraocular movements intact  Respiratory: Patient's speak in full sentences and does not appear short of breath  Cardiovascular: No lower extremity edema, non tender, no erythema  Skin: Warm dry intact with no signs of infection or rash on extremities or on axial skeleton.  Abdomen: Soft nontender  Neuro: Cranial nerves II through XII are intact, neurovascularly intact in all extremities with 2+ DTRs and 2+ pulses.  Lymph: No lymphadenopathy of posterior or anterior cervical chain or axillae bilaterally.  Gait antalgic gait MSK:  Non tender with full range of motion and good stability and symmetric strength and tone of shoulders, elbows, wrist, hip, knees bilaterally.     Ankle:left ankle Patient does have swelling as well as a Haglund's nodule. Range of motion is full in all directions. Strength is 5/5 in all directions. Stable lateral and medial ligaments; squeeze test and kleiger test unremarkable; Talar dome nontender; No pain at base of 5th MT; No tenderness over cuboid; No tenderness over N spot or navicular prominence No tenderness on posterior aspects of lateral and medial malleolus No sign of peroneal tendon subluxations or tenderness to palpation Negative tarsal tunnel tinel's Able to walk 4 steps but very painful with antalgic gait Patient is only minimally tender over the insertion of the Achilles Contralateral ankle has no erythema but does have Haglund's nodule. Otherwise unremarkable.  MSK US performed of: left ankle This study was ordered, performed, and interpreted by Charlann Boxer D.O.  Foot/Ankle:   All structures visualized.   Talar dome unremarkable  Ankle mortise without effusion. Peroneus longus and brevis tendons unremarkable on long and transverse views without sheath effusions. Posterior tibialis, flexor hallucis longus, and flexor digitorum longus tendons unremarkable on long and transverse views without sheath effusions. Achilles tendon visualized skin shows the patient does have a partial tear at its insertion but significant improvement with scar tissue formation.  Decreased calcific changes as well Posterior heel spur noted. Increasing Doppler flow noted. Anterior Talofibular Ligament and Calcaneofibular Ligaments unremarkable and intact. Deltoid Ligament unremarkable and intact. Plantar fascia intact and without effusion, normal thickness. No increased doppler signal, cap sign, or thickening of tibial cortex.  IMPRESSION:  Partial Achilles tear with significant improvement and decreased calcific changes.      Impression and Recommendations:  This case required medical decision making of moderate  complexity.

## 2014-02-17 NOTE — Patient Instructions (Signed)
Good to se eyou Ice when you need it Stretch it when you can and come out of the boot in 1 week and wear regular shoe in the home  Otherwise out of the home stay in boot for 2 weeks Seated work for 2 weeks See me again in 2 weeks.

## 2014-02-17 NOTE — Assessment & Plan Note (Signed)
Patient is doing relatively well with conservative therapy. Discuss continuing the icing regimen as well as to continue the Cam Walker for another week. At the end of the week patient will start wearing a shoe in the house but continue the Cam [place] Rile out of the house. We discussed the range of motion exercises and some of the strengthening exercises. Patient and will come back in 2 weeks for further evaluation. Patient continues seated work only at work.

## 2014-03-03 ENCOUNTER — Ambulatory Visit: Payer: BC Managed Care – PPO | Admitting: Family Medicine

## 2014-03-06 ENCOUNTER — Encounter: Payer: Self-pay | Admitting: Family Medicine

## 2014-03-06 ENCOUNTER — Ambulatory Visit (INDEPENDENT_AMBULATORY_CARE_PROVIDER_SITE_OTHER): Payer: BC Managed Care – PPO | Admitting: Family Medicine

## 2014-03-06 ENCOUNTER — Ambulatory Visit (INDEPENDENT_AMBULATORY_CARE_PROVIDER_SITE_OTHER): Payer: BC Managed Care – PPO

## 2014-03-06 VITALS — BP 114/62 | HR 88 | Ht 68.0 in | Wt 179.0 lb

## 2014-03-06 DIAGNOSIS — M7660 Achilles tendinitis, unspecified leg: Secondary | ICD-10-CM

## 2014-03-06 DIAGNOSIS — M7662 Achilles tendinitis, left leg: Secondary | ICD-10-CM

## 2014-03-06 MED ORDER — MELOXICAM 15 MG PO TABS: 15.0000 mg | ORAL_TABLET | Freq: Every day | ORAL | Status: DC

## 2014-03-06 NOTE — Patient Instructions (Addendum)
Good to see you Ice at the end of the day.  Wear heel lift in left shoe for next month.  Try meloxicam daily for 10 days then as needed See me when you need me.

## 2014-03-06 NOTE — Progress Notes (Signed)
[facility] [address], [place] [address] Phone: [phone] Subjective:    CC: left heel pain follow up  [id number] Field Staniszewski is a 58 y.o. male coming in with complaint of left heel pain.  Patient was seen previously and did have a partial tearof the Achilles. Patient states that he is doing significantly better. Patient states that he is ambulating in the [employer] without any pain. Patient has been walking in a shoe at home and is been doing relatively well. Patient did wear shoe out shopping the other day and does have some mild discomfort today. Overall though seems to be doing well. Has not taken any medications.     Past medical history, social, surgical and family history all reviewed in electronic medical record.   Review of Systems: No headache, visual changes, nausea, vomiting, diarrhea, constipation, dizziness, abdominal pain, skin rash, fevers, chills, night sweats, weight loss, swollen lymph nodes, body aches, joint swelling, muscle aches, chest pain, shortness of breath, mood changes.   Objective Blood pressure 114/62, pulse 88, height 5\' 8"  (1.727 m), weight 179 lb (81.194 kg), SpO2 95 %.  General: No apparent distress alert and oriented x3 mood and affect normal, dressed appropriately.  HEENT: Pupils equal, extraocular movements intact  Respiratory: Patient's speak in full sentences and does not appear short of breath  Cardiovascular: No lower extremity edema, non tender, no erythema  Skin: Warm dry intact with no signs of infection or rash on extremities or on axial skeleton.  Abdomen: Soft nontender  Neuro: Cranial nerves II through XII are intact, neurovascularly intact in all extremities with 2+ DTRs and 2+ pulses.  Lymph: No lymphadenopathy of posterior or anterior cervical chain or axillae bilaterally.  Gait antalgic gait MSK:  Non tender with full range of motion and good stability and symmetric strength and tone of  shoulders, elbows, wrist, hip, knees bilaterally.   Ankle:left ankle Decreased swelling of Haglund's nodule Range of motion is full in all directions. Strength is 5/5 in all directions. Stable lateral and medial ligaments; squeeze test and kleiger test unremarkable; Talar dome nontender; No pain at base of 5th MT; No tenderness over cuboid; No tenderness over N spot or navicular prominence No tenderness on posterior aspects of lateral and medial malleolus No sign of peroneal tendon subluxations or tenderness to palpation Negative tarsal tunnel tinel's Able to walk 4 steps but very painful with antalgic gait Minimal tenderness still present at the insertion of the Achilles Contralateral ankle has no erythema but does have Haglund's nodule. Otherwise unremarkable.  MSK US performed of: left ankle This study was ordered, performed, and interpreted by Charlann Boxer D.O.  Foot/Ankle:   All structures visualized.   Talar dome unremarkable  Ankle mortise without effusion. Peroneus longus and brevis tendons unremarkable on long and transverse views without sheath effusions. Posterior tibialis, flexor hallucis longus, and flexor digitorum longus tendons unremarkable on long and transverse views without sheath effusions. Achilles tendon visualized show significant decrease in swelling. Patient's tear is completely resolved at this time. Mild calcific changes still noted near insertion but significantly improved. Increasing Doppler flow noted. Anterior Talofibular Ligament and Calcaneofibular Ligaments unremarkable and intact. Deltoid Ligament unremarkable and intact. Plantar fascia intact and without effusion, normal thickness. No increased doppler signal, cap sign, or thickening of tibial cortex. Mild retrocalcaneal bursa with inflammation noted.  IMPRESSION:  Complete heel of the partial tear of the Achilles with still mild calcific changes still noted. Mild retrocalcaneal  bursitis      Impression and Recommendations:     This case required medical decision making of moderate complexity.

## 2014-03-06 NOTE — Assessment & Plan Note (Signed)
Patient is improving significant. Patient was given a heel lift. We discussed icing regimen and patient will take meloxicam for 10 days for the retrocalcaneal bursa. Patient will try these different changes and if having any worsening pain will come back in 3 weeks. Patient may respond well to a retrocalcaneal bursitis injection if necessary. Otherwise patient can follow-up on an as-needed basis.  Spent greater than 25 minutes with patient face-to-face and had greater than 50% of counseling including as described above in assessment and plan.

## 2014-03-19 ENCOUNTER — Encounter: Payer: Self-pay | Admitting: Internal Medicine

## 2014-03-19 ENCOUNTER — Ambulatory Visit (INDEPENDENT_AMBULATORY_CARE_PROVIDER_SITE_OTHER): Payer: BC Managed Care – PPO | Admitting: Internal Medicine

## 2014-03-19 VITALS — BP 140/86 | HR 75 | Temp 97.9°F | Ht 68.0 in | Wt 169.0 lb

## 2014-03-19 DIAGNOSIS — J209 Acute bronchitis, unspecified: Secondary | ICD-10-CM

## 2014-03-19 MED ORDER — HYDROCODONE-HOMATROPINE 5-1.5 MG/5ML PO SYRP
5.0000 mL | ORAL_SOLUTION | Freq: Four times a day (QID) | ORAL | Status: DC | PRN
Start: 2014-03-19 — End: 2014-06-17

## 2014-03-19 MED ORDER — LEVOFLOXACIN 250 MG PO TABS: 250.0000 mg | ORAL_TABLET | Freq: Every day | ORAL | Status: DC

## 2014-03-19 NOTE — Patient Instructions (Signed)
Please take all new medication as prescribed - the antibiotic, and cough medicine  Please continue all other medications as before, and refills have been done if requested.  Please have the pharmacy call with any other refills you may need.  Please keep your appointments with your specialists as you may have planned      

## 2014-03-19 NOTE — Progress Notes (Signed)
Pre visit review using our clinic review tool, if applicable. No additional management support is needed unless otherwise documented below in the visit note. 

## 2014-03-21 DIAGNOSIS — J209 Acute bronchitis, unspecified: Secondary | ICD-10-CM | POA: Insufficient documentation

## 2014-03-21 NOTE — Progress Notes (Signed)
   Subjective:    Patient ID: Nester Bachus, male    DOB: 11-05-1955, 58 y.o.   MRN: [id number]  HPI  Here with acute onset mild to mod 2-3 days ST, HA, general weakness and malaise, with prod cough greenish sputum, but Pt denies chest pain, increased sob or doe, wheezing, orthopnea, PND, increased LE swelling, palpitations, dizziness or syncope. Past Medical History  Diagnosis Date  . ANEMIA-IRON DEFICIENCY 11/05/2007  . ALLERGIC RHINITIS 06/07/2007  . Irritable bowel syndrome 02/04/2010  . Cervical spondylosis without myelopathy 06/07/2007  . MYOFASCIAL PAIN SYNDROME 06/07/2007  . Hyperlipidemia 12/13/2013   No past surgical history on file.  reports that he has never smoked. He has never used smokeless tobacco. He reports that he does not drink alcohol or use illicit drugs. family history includes Cancer (age of onset: 74) in his mother; Heart disease in his father; Hyperlipidemia in his brother; Hypertension in his other. Allergies  Allergen Reactions  . Crestor [Rosuvastatin]     Joint pain   Current Outpatient Prescriptions on File Prior to Visit  Medication Sig Dispense Refill  . allopurinol (ZYLOPRIM) 100 MG tablet Take 1 tablet (100 mg total) by mouth at bedtime. 90 tablet 3  . aspirin EC 81 MG tablet Take 1 tablet (81 mg total) by mouth daily. 90 tablet 11  . atorvastatin (LIPITOR) 20 MG tablet Take 1 tablet (20 mg total) by mouth daily. 90 tablet 3   No current facility-administered medications on file prior to visit.   Review of Systems All otherwise neg per pt     Objective:   Physical Exam BP 140/86 mmHg  Pulse 75  Temp(Src) 97.9 F (36.6 C) (Oral)  Ht 5\' 8"  (1.727 m)  Wt 169 lb (76.658 kg)  BMI 25.70 kg/m2  SpO2 97% VS noted, mild ill Constitutional: Pt appears well-developed, well-nourished.  HENT: Head: NCAT.  Right Ear: External ear normal.  Left Ear: External ear normal.  Eyes: . Pupils are equal, round, and reactive to light. Conjunctivae and EOM are  normal Bilat tm's with mild erythema.  Max sinus areas mild tender.  Pharynx with mild erythema, no exudate Neck: Normal range of motion. Neck supple.  Cardiovascular: Normal rate and regular rhythm.   Pulmonary/Chest: Effort normal and breath sounds normal.  - no rales or wheezing Neurological: Pt is alert. Not confused , motor grossly intact Skin: Skin is warm. No rash Psychiatric: Pt behavior is normal. No agitation.     Assessment & Plan:

## 2014-03-21 NOTE — Assessment & Plan Note (Signed)
Mild to mod, for antibx course,  to f/u any worsening symptoms or concerns 

## 2014-04-01 ENCOUNTER — Ambulatory Visit: Payer: BC Managed Care – PPO | Admitting: Family Medicine

## 2014-04-03 DIAGNOSIS — R404 Transient alteration of awareness: Secondary | ICD-10-CM

## 2014-04-03 HISTORY — DX: Transient alteration of awareness: R40.4

## 2014-04-11 NOTE — Addendum Note (Signed)
Addended by: Biagio Borg on: 04/11/2014 06:45 PM   Modules accepted: Miquel Dunn

## 2014-06-17 ENCOUNTER — Ambulatory Visit (INDEPENDENT_AMBULATORY_CARE_PROVIDER_SITE_OTHER): Payer: BLUE CROSS/BLUE SHIELD | Admitting: Internal Medicine

## 2014-06-17 ENCOUNTER — Encounter: Payer: Self-pay | Admitting: Internal Medicine

## 2014-06-17 VITALS — BP 132/80 | HR 84 | Temp 98.4°F | Resp 18 | Ht 68.0 in | Wt 175.1 lb

## 2014-06-17 DIAGNOSIS — G459 Transient cerebral ischemic attack, unspecified: Secondary | ICD-10-CM

## 2014-06-17 DIAGNOSIS — E785 Hyperlipidemia, unspecified: Secondary | ICD-10-CM

## 2014-06-17 MED ORDER — ASPIRIN EC 325 MG PO TBEC: 325.0000 mg | DELAYED_RELEASE_TABLET | Freq: Every day | ORAL | Status: DC

## 2014-06-17 MED ORDER — ASPIRIN EC 325 MG PO TBEC
325.0000 mg | DELAYED_RELEASE_TABLET | Freq: Every day | ORAL | Status: DC
Start: 2014-06-17 — End: 2014-06-17

## 2014-06-17 NOTE — Assessment & Plan Note (Addendum)
Vs small cva, symptoms resolved after 3 days, no residual symptoms, ECG reviewed as per emr - no afib noted; to increase the asa to 325, start the lipitor he has not started yet 20 mg, for echo, carotids, MRI head and neurology referral,  to f/u any worsening symptoms or concerns, should likely stay away from any reffrigerant exposure at work if this could have been related  Note:  Total time for pt hx, exam, review of record with pt in the room, determination of diagnoses and plan for further eval and tx is > 40 min, with over 50% spent in coordination and counseling of patient

## 2014-06-17 NOTE — Patient Instructions (Addendum)
Please start the lipitor as prescribed  OK to stop the aspirin 81 mg, and take the Aspirin 325 mg per day  Your EKG was OK today  Please continue all other medications as before, and refills have been done if requested.  Please have the pharmacy call with any other refills you may need.  Please continue your efforts at being more active, low cholesterol diet, and weight control.  Please keep your appointments with your specialists as you may have planned  You will be contacted regarding the referral for: Head MRI, carotid duplex testing, echocardiogram, and Neurology

## 2014-06-17 NOTE — Progress Notes (Signed)
Pre visit review using our clinic review tool, if applicable. No additional management support is needed unless otherwise documented below in the visit note. 

## 2014-06-17 NOTE — Progress Notes (Signed)
Subjective:    Patient ID: David Arias, male    DOB: [birth year], 59 y.o.   MRN: [id number]  HPI  Here with fatigue, dizzy, facial numbness, was working last wk, felt poorly towards the end the wk, had some GI upset it seems with unusual belching 2 days in a row, had severe HA, went home early from work on Friday, felt foggy/tired went to bed early. Next day still felt foggy, with HA, had trouble with speech  - speaking not in complete sentences, no slurring but more like trouble coming up with words, feels like in a tunnel in that he had to really concentrate to comprehend and then respond to questions. Felt felt "swollen" but not sweollen obvius sweling by girlfriend, had congnitive diffulty until Sun am, but still had the facial swelling sensation and HA until Mon am, with HA recurring mild since then. Never had drooling or drooping of fae per girlffirne,  + blurred vision, no N/V, no photophobia, no hx of significant migraine. Is not taking his statin supposed to start in dec, Is taking the asa 81 mg.  No fever.  Has been exposed to refrigerant at work last wk, alamrs going off on site.  He works alone in that particular area. No other workers sick. Past Medical History  Diagnosis Date  . ANEMIA-IRON DEFICIENCY 11/05/2007  . ALLERGIC RHINITIS 06/07/2007  . Irritable bowel syndrome 02/04/2010  . Cervical spondylosis without myelopathy 06/07/2007  . MYOFASCIAL PAIN SYNDROME 06/07/2007  . Hyperlipidemia 12/13/2013   No past surgical history on file.  reports that he has never smoked. He has never used smokeless tobacco. He reports that he does not drink alcohol or use illicit drugs. family history includes Cancer (age of onset: 110) in his mother; Heart disease in his father; Hyperlipidemia in his brother; Hypertension in his other. Allergies  Allergen Reactions  . Crestor [Rosuvastatin]     Joint pain   Current Outpatient Prescriptions on File Prior to Visit  Medication Sig Dispense Refill  .  allopurinol (ZYLOPRIM) 100 MG tablet Take 1 tablet (100 mg total) by mouth at bedtime. 90 tablet 3  . aspirin EC 81 MG tablet Take 1 tablet (81 mg total) by mouth daily. 90 tablet 11  . atorvastatin (LIPITOR) 20 MG tablet Take 1 tablet (20 mg total) by mouth daily. 90 tablet 3   No current facility-administered medications on file prior to visit.   Review of Systems  Constitutional: Negative for unusual diaphoresis or night sweats HENT: Negative for ringing in ear or discharge Eyes: Negative for double vision or worsening visual disturbance.  Respiratory: Negative for choking and stridor.   Gastrointestinal: Negative for vomiting or other signifcant bowel change Genitourinary: Negative for hematuria or change in urine volume.  Musculoskeletal: Negative for other MSK pain or swelling Skin: Negative for color change and worsening wound.  Neurological: Negative for tremors and numbness other than noted  Psychiatric/Behavioral: Negative for decreased concentration or agitation other than above       Objective:   Physical Exam BP 132/80 mmHg  Pulse 84  Temp(Src) 98.4 F (36.9 C) (Oral)  Resp 18  Ht 5\' 8"  (1.727 m)  Wt 175 lb 1.9 oz (79.434 kg)  BMI 26.63 kg/m2  SpO2 97% VS noted,  Constitutional: Pt appears in no significant distress HENT: Head: NCAT.  Right Ear: External ear normal.  Left Ear: External ear normal.  Eyes: . Pupils are equal, round, and reactive to light. Conjunctivae and EOM are normal  Neck: Normal range of motion. Neck supple.  Cardiovascular: Normal rate and regular rhythm.   Pulmonary/Chest: Effort normal and breath sounds without rales or wheezing.  Abd:  Soft, NT, ND, + BS Neurological: Pt is alert. Not confused , motor grossly intact Skin: Skin is warm. No rash, no LE edema Psychiatric: Pt behavior is normal. No agitation.     Assessment & Plan:

## 2014-06-18 ENCOUNTER — Emergency Department (HOSPITAL_COMMUNITY): Payer: BLUE CROSS/BLUE SHIELD

## 2014-06-18 ENCOUNTER — Encounter (HOSPITAL_COMMUNITY): Payer: Self-pay | Admitting: Emergency Medicine

## 2014-06-18 ENCOUNTER — Emergency Department (HOSPITAL_COMMUNITY)
Admission: EM | Admit: 2014-06-18 | Discharge: 2014-06-18 | Disposition: A | Discharge status: Home / Self Care | Payer: BLUE CROSS/BLUE SHIELD | Attending: Emergency Medicine | Admitting: Emergency Medicine

## 2014-06-18 DIAGNOSIS — Z862 Personal history of diseases of the blood and blood-forming organs and certain disorders involving the immune mechanism: Secondary | ICD-10-CM | POA: Insufficient documentation

## 2014-06-18 DIAGNOSIS — Z8719 Personal history of other diseases of the digestive system: Secondary | ICD-10-CM | POA: Diagnosis not present

## 2014-06-18 DIAGNOSIS — Z8739 Personal history of other diseases of the musculoskeletal system and connective tissue: Secondary | ICD-10-CM | POA: Diagnosis not present

## 2014-06-18 DIAGNOSIS — R200 Anesthesia of skin: Secondary | ICD-10-CM | POA: Diagnosis not present

## 2014-06-18 DIAGNOSIS — Z79899 Other long term (current) drug therapy: Secondary | ICD-10-CM | POA: Insufficient documentation

## 2014-06-18 DIAGNOSIS — E785 Hyperlipidemia, unspecified: Secondary | ICD-10-CM | POA: Diagnosis not present

## 2014-06-18 DIAGNOSIS — Z7982 Long term (current) use of aspirin: Secondary | ICD-10-CM | POA: Diagnosis not present

## 2014-06-18 LAB — DIFFERENTIAL
Basophils Absolute: 0.1 10*3/uL (ref 0.0–0.1)
Basophils Relative: 1 % (ref 0–1)
Eosinophils Absolute: 0.2 10*3/uL (ref 0.0–0.7)
Eosinophils Relative: 2 % (ref 0–5)
Lymphocytes Relative: 23 % (ref 12–46)
Lymphs Abs: 2 10*3/uL (ref 0.7–4.0)
Monocytes Absolute: 0.9 10*3/uL (ref 0.1–1.0)
Monocytes Relative: 10 % (ref 3–12)
NEUTROS PCT: 64 % (ref 43–77)
Neutro Abs: 5.5 10*3/uL (ref 1.7–7.7)

## 2014-06-18 LAB — COMPREHENSIVE METABOLIC PANEL
ALK PHOS: 95 U/L (ref 39–117)
ALT: 34 U/L (ref 0–53)
AST: 24 U/L (ref 0–37)
Albumin: 3.9 g/dL (ref 3.5–5.2)
Anion gap: 11 (ref 5–15)
BUN: 12 mg/dL (ref 6–23)
CHLORIDE: 105 mmol/L (ref 96–112)
CO2: 25 mmol/L (ref 19–32)
CREATININE: 0.86 mg/dL (ref 0.50–1.35)
Calcium: 10 mg/dL (ref 8.4–10.5)
GFR calc non Af Amer: >90 mL/min (ref >90)
GLUCOSE: 95 mg/dL (ref 70–99)
Potassium: 4.1 mmol/L (ref 3.5–5.1)
Sodium: 141 mmol/L (ref 135–145)
TOTAL PROTEIN: 7.2 g/dL (ref 6.0–8.3)
Total Bilirubin: 0.5 mg/dL (ref 0.3–1.2)

## 2014-06-18 LAB — CBC
HCT: 42.5 % (ref 39.0–52.0)
Hemoglobin: 14.2 g/dL (ref 13.0–17.0)
MCH: 28.5 pg (ref 26.0–34.0)
MCHC: 33.4 g/dL (ref 30.0–36.0)
MCV: 85.2 fL (ref 78.0–100.0)
Platelets: 311 10*3/uL (ref 150–400)
RBC: 4.99 MIL/uL (ref 4.22–5.81)
RDW: 13.4 % (ref 11.5–15.5)
WBC: 8.6 10*3/uL (ref 4.0–10.5)

## 2014-06-18 LAB — APTT: aPTT: 35 seconds (ref 24–37)

## 2014-06-18 LAB — PROTIME-INR
INR: 1 (ref 0.00–1.49)
Prothrombin Time: 13.3 seconds (ref 11.6–15.2)

## 2014-06-18 LAB — I-STAT TROPONIN, ED: Troponin i, poc: 0 ng/mL (ref 0.00–0.08)

## 2014-06-18 NOTE — ED Notes (Addendum)
Pt from home with c/o left sided facial numbness, headaches, "spots" in vision, and "fogginess" intermittently since this past Friday.  Pt was seen by PCP yesterday with plans to follow up imaging next week for TIAs, increased aspirin dose to 325 mg/day and was started on lipitor.  Pt reports his face became numb again last night at around 7 pm and it has not stopped.  Denies N/V.  Pt A&O, NAD.

## 2014-06-18 NOTE — ED Notes (Signed)
Pt stated he could not urinate at this time due to going to bathroom before his MRI. Stated he will try again soon

## 2014-06-18 NOTE — ED Notes (Signed)
Pt given water so he would be able to urinate

## 2014-06-18 NOTE — ED Provider Notes (Signed)
CSN: [id number]     Arrival date & time 06/18/14  0818 History   First MD Initiated Contact with Patient 06/18/14 0845     Chief Complaint  Patient presents with  . Numbness     (Consider location/radiation/quality/duration/timing/severity/associated sxs/prior Treatment) HPI The patient symptoms started on Friday at work. This was 2-1/2 days ago. Initially he reports he just didn't feel well by afternoon on Friday. The symptoms included fatigue, a mild to moderate headache behind his eyes and at his temples which he could not recall if the headache specifically preceded the feelings of numbness or not. There is been no fever and no neck stiffness. He has noted some mild blurred vision and a few "spots" in his vision. The other symptom of note was that he felt that one pupil were speaking to him he had to concentrate more on what they were saying to understand. He denies he was having difficulty generating his own speech, he had no slurred speech. The sensation of numbness has been somewhat variable from the right to the left side of the face. He however states that has predominantly been on the left and it was awakening with left-sided facial numbness today that brought him into the emergency department. He has no associated gait dysfunction, no associated extremity weakness or numbness. The patient denies any recent illness. He denies any prior history of TIA, stroke or similar symptoms. The patient notes he had some burping, he reports however he has not vomited in over 20 years. Past Medical History  Diagnosis Date  . ANEMIA-IRON DEFICIENCY 11/05/2007  . ALLERGIC RHINITIS 06/07/2007  . Irritable bowel syndrome 02/04/2010  . Cervical spondylosis without myelopathy 06/07/2007  . MYOFASCIAL PAIN SYNDROME 06/07/2007  . Hyperlipidemia 12/13/2013   History reviewed. No pertinent past surgical history. Family History  Problem Relation Age of Onset  . Cancer Mother 31    Colon Cancer  . Heart disease  Father   . Hyperlipidemia Brother   . Hypertension Other    History  Substance Use Topics  . Smoking status: Never Smoker   . Smokeless tobacco: Never Used  . Alcohol Use: No    Review of Systems 10 Systems reviewed and are negative for acute change except as noted in the HPI.    Allergies  Crestor  Home Medications   Prior to Admission medications   Medication Sig Start Date End Date Taking? Authorizing Provider  allopurinol (ZYLOPRIM) 100 MG tablet Take 1 tablet (100 mg total) by mouth at bedtime. 01/28/14  Yes Biagio Borg, MD  aspirin EC 325 MG tablet Take 1 tablet (325 mg total) by mouth daily. 06/17/14  Yes Biagio Borg, MD  atorvastatin (LIPITOR) 20 MG tablet Take 1 tablet (20 mg total) by mouth daily. 12/12/13 12/12/14 Yes Biagio Borg, MD   BP 145/80 mmHg  Pulse 68  Temp(Src) 97.5 F (36.4 C) (Oral)  Resp 13  SpO2 99% Physical Exam  Constitutional: He is oriented to person, place, and time. He appears well-developed and well-nourished.  HENT:  Head: Normocephalic and atraumatic.  Eyes: EOM are normal. Pupils are equal, round, and reactive to light.  Neck: Neck supple.  Cardiovascular: Normal rate, regular rhythm, normal heart sounds and intact distal pulses.   Pulmonary/Chest: Effort normal and breath sounds normal.  Abdominal: Soft. Bowel sounds are normal. He exhibits no distension. There is no tenderness.  Musculoskeletal: Normal range of motion. He exhibits no edema.  Neurological: He is alert and oriented to person,  place, and time. He has normal strength. No cranial nerve deficit. He exhibits normal muscle tone. Coordination normal. GCS eye subscore is 4. GCS verbal subscore is 5. GCS motor subscore is 6.  Normal extraocular motions, normal pupillary responses. Normal sensation to light touch bilaterally on the face. Normal heel shin bilaterally. No pronator drift and normal finger-nose examination. No motor deficit.  Skin: Skin is warm, dry and intact.   Psychiatric: He has a normal mood and affect.    ED Course  Procedures (including critical care time) Labs Review Labs Reviewed  PROTIME-INR  APTT  CBC  DIFFERENTIAL  COMPREHENSIVE METABOLIC PANEL  URINE RAPID DRUG SCREEN (HOSP PERFORMED)  URINALYSIS, ROUTINE W REFLEX MICROSCOPIC  Randolm Idol, ED    Imaging Review Mr Angiogram Head Wo Contrast  06/18/2014   CLINICAL DATA:  59 year old male with left facial numbness, headache, visual changes. Symptoms x5 days. Suspected TIAs. Initial encounter.  EXAM: MRI HEAD WITHOUT CONTRAST  MRA HEAD WITHOUT CONTRAST  TECHNIQUE: Multiplanar, multiecho pulse sequences of the brain and surrounding structures were obtained without intravenous contrast. Angiographic images of the head were obtained using MRA technique without contrast.  COMPARISON:  Cervical spine MRI 07/04/2007.  FINDINGS: MRI HEAD FINDINGS  Cerebral volume is within normal limits for age. No restricted diffusion to suggest acute infarction. No midline shift, mass effect, evidence of mass lesion, ventriculomegaly, extra-axial collection or acute intracranial hemorrhage. Cervicomedullary junction and pituitary are within normal limits. Negative visualized cervical spine. Major intracranial vascular flow voids are preserved.  Mild for age scattered small mostly subcortical white matter T2 and FLAIR hyperintense foci, nonspecific. No cortical encephalomalacia. Otherwise gray and white matter signal is within normal limits.  Visible internal auditory structures appear normal. Mastoids are clear. Mild paranasal sinus mucosal thickening. Visualized orbit soft tissues are within normal limits. Visualized scalp soft tissues are within normal limits. Normal bone marrow signal.  MRA HEAD FINDINGS  Antegrade flow in the posterior circulation with codominant distal vertebral arteries. Normal PICA origins. Normal vertebrobasilar junction. No basilar stenosis. Normal SCA and right PCA origins. Fetal type  left PCA origin. Right posterior communicating artery diminutive or absent. Bilateral PCA branches are within normal limits.  Antegrade flow in both ICA siphons. Tortuous distal cervical left ICA just below the skullbase. No siphon stenosis. Ophthalmic and left posterior communicating artery origins are within normal limits. Normal carotid termini, MCA and ACA origins. Anterior communicating artery within normal limits. Left ACA is dominant. Visualized bilateral ACA branches are within normal limits. Visualized bilateral MCA branches are within normal limits.  IMPRESSION: 1.  No acute intracranial abnormality. 2. No evidence of any previous cortically based infarct. Mild for age nonspecific white matter signal changes which can be related to small vessel disease. 3.  Negative intracranial MRA.   Electronically Signed   By: Genevie Ann M.D.   On: 06/18/2014 11:11   Mr Brain Wo Contrast  06/18/2014   CLINICAL DATA:  59 year old male with left facial numbness, headache, visual changes. Symptoms x5 days. Suspected TIAs. Initial encounter.  EXAM: MRI HEAD WITHOUT CONTRAST  MRA HEAD WITHOUT CONTRAST  TECHNIQUE: Multiplanar, multiecho pulse sequences of the brain and surrounding structures were obtained without intravenous contrast. Angiographic images of the head were obtained using MRA technique without contrast.  COMPARISON:  Cervical spine MRI 07/04/2007.  FINDINGS: MRI HEAD FINDINGS  Cerebral volume is within normal limits for age. No restricted diffusion to suggest acute infarction. No midline shift, mass effect, evidence of mass lesion, ventriculomegaly,  extra-axial collection or acute intracranial hemorrhage. Cervicomedullary junction and pituitary are within normal limits. Negative visualized cervical spine. Major intracranial vascular flow voids are preserved.  Mild for age scattered small mostly subcortical white matter T2 and FLAIR hyperintense foci, nonspecific. No cortical encephalomalacia. Otherwise gray and  white matter signal is within normal limits.  Visible internal auditory structures appear normal. Mastoids are clear. Mild paranasal sinus mucosal thickening. Visualized orbit soft tissues are within normal limits. Visualized scalp soft tissues are within normal limits. Normal bone marrow signal.  MRA HEAD FINDINGS  Antegrade flow in the posterior circulation with codominant distal vertebral arteries. Normal PICA origins. Normal vertebrobasilar junction. No basilar stenosis. Normal SCA and right PCA origins. Fetal type left PCA origin. Right posterior communicating artery diminutive or absent. Bilateral PCA branches are within normal limits.  Antegrade flow in both ICA siphons. Tortuous distal cervical left ICA just below the skullbase. No siphon stenosis. Ophthalmic and left posterior communicating artery origins are within normal limits. Normal carotid termini, MCA and ACA origins. Anterior communicating artery within normal limits. Left ACA is dominant. Visualized bilateral ACA branches are within normal limits. Visualized bilateral MCA branches are within normal limits.  IMPRESSION: 1.  No acute intracranial abnormality. 2. No evidence of any previous cortically based infarct. Mild for age nonspecific white matter signal changes which can be related to small vessel disease. 3.  Negative intracranial MRA.   Electronically Signed   By: Genevie Ann M.D.   On: 06/18/2014 11:11     EKG Interpretation   Date/Time:  Thursday June 18 2014 08:30:12 EDT Ventricular Rate:  72 PR Interval:  165 QRS Duration: 94 QT Interval:  365 QTC Calculation: 399 R Axis:   -3 Text Interpretation:  Sinus rhythm Abnormal R-wave progression, early  transition Baseline wander in lead(s) V5 no significant change from prior.  no ischemic appearance Confirmed by Johnney Killian, MD, Jeannie Done [phone]) on  06/18/2014 12:08:41 PM     Consult: The patient's case was reviewed with Dr. Janann Colonel at 920. After reviewing the case he suggests MRI  without contrast and MRA. If these are within normal limits patient can continue further outpatient management. MDM   Final diagnoses:  Left facial numbness   The patient's MRI does not show any CVA or vascular anomaly. At this time the patient is safe for continued outpatient evaluation with his primary care provider and neurology. The neurologic symptoms are poorly localizing and did not involve any motor dysfunction. There is no evidence of an infectious etiology. The patient is discharged in stable condition with ongoing outpatient evaluation planned.    Charlesetta Shanks, MD 06/18/14 1215

## 2014-06-18 NOTE — ED Notes (Signed)
Last known normal:   Monday.

## 2014-06-18 NOTE — Discharge Instructions (Signed)

## 2014-06-18 NOTE — Assessment & Plan Note (Signed)
To start statin,  to f/u any worsening symptoms or concerns

## 2014-06-22 ENCOUNTER — Encounter (HOSPITAL_COMMUNITY): Payer: Self-pay | Admitting: Family Medicine

## 2014-06-22 ENCOUNTER — Emergency Department (HOSPITAL_COMMUNITY)
Admission: EM | Admit: 2014-06-22 | Discharge: 2014-06-22 | Disposition: A | Discharge status: Home / Self Care | Payer: BLUE CROSS/BLUE SHIELD | Attending: Emergency Medicine | Admitting: Emergency Medicine

## 2014-06-22 DIAGNOSIS — R200 Anesthesia of skin: Secondary | ICD-10-CM | POA: Diagnosis not present

## 2014-06-22 DIAGNOSIS — Z7982 Long term (current) use of aspirin: Secondary | ICD-10-CM | POA: Insufficient documentation

## 2014-06-22 DIAGNOSIS — E785 Hyperlipidemia, unspecified: Secondary | ICD-10-CM | POA: Diagnosis not present

## 2014-06-22 DIAGNOSIS — Z79899 Other long term (current) drug therapy: Secondary | ICD-10-CM | POA: Diagnosis not present

## 2014-06-22 DIAGNOSIS — Z8719 Personal history of other diseases of the digestive system: Secondary | ICD-10-CM | POA: Insufficient documentation

## 2014-06-22 DIAGNOSIS — R202 Paresthesia of skin: Secondary | ICD-10-CM

## 2014-06-22 DIAGNOSIS — Z8739 Personal history of other diseases of the musculoskeletal system and connective tissue: Secondary | ICD-10-CM | POA: Insufficient documentation

## 2014-06-22 DIAGNOSIS — Z862 Personal history of diseases of the blood and blood-forming organs and certain disorders involving the immune mechanism: Secondary | ICD-10-CM | POA: Diagnosis not present

## 2014-06-22 LAB — COMPREHENSIVE METABOLIC PANEL
ALBUMIN: 4 g/dL (ref 3.5–5.2)
ALK PHOS: 104 U/L (ref 39–117)
ALT: 24 U/L (ref 0–53)
ANION GAP: 7 (ref 5–15)
AST: 21 U/L (ref 0–37)
BILIRUBIN TOTAL: 0.6 mg/dL (ref 0.3–1.2)
BUN: 13 mg/dL (ref 6–23)
CO2: 26 mmol/L (ref 19–32)
Calcium: 9.2 mg/dL (ref 8.4–10.5)
Chloride: 103 mmol/L (ref 96–112)
Creatinine, Ser: 0.88 mg/dL (ref 0.50–1.35)
GFR calc Af Amer: >90 mL/min (ref >90)
Glucose, Bld: 118 mg/dL — ABNORMAL HIGH (ref 70–99)
Potassium: 3.7 mmol/L (ref 3.5–5.1)
SODIUM: 136 mmol/L (ref 135–145)
TOTAL PROTEIN: 7.2 g/dL (ref 6.0–8.3)

## 2014-06-22 LAB — DIFFERENTIAL
Basophils Absolute: 0.1 10*3/uL (ref 0.0–0.1)
Basophils Relative: 1 % (ref 0–1)
EOS PCT: 3 % (ref 0–5)
Eosinophils Absolute: 0.3 10*3/uL (ref 0.0–0.7)
LYMPHS ABS: 2.2 10*3/uL (ref 0.7–4.0)
LYMPHS PCT: 25 % (ref 12–46)
Monocytes Absolute: 1 10*3/uL (ref 0.1–1.0)
Monocytes Relative: 12 % (ref 3–12)
NEUTROS ABS: 5.3 10*3/uL (ref 1.7–7.7)
Neutrophils Relative %: 59 % (ref 43–77)

## 2014-06-22 LAB — CBC
HEMATOCRIT: 41.1 % (ref 39.0–52.0)
HEMOGLOBIN: 13.9 g/dL (ref 13.0–17.0)
MCH: 28.5 pg (ref 26.0–34.0)
MCHC: 33.8 g/dL (ref 30.0–36.0)
MCV: 84.4 fL (ref 78.0–100.0)
Platelets: 327 10*3/uL (ref 150–400)
RBC: 4.87 MIL/uL (ref 4.22–5.81)
RDW: 13 % (ref 11.5–15.5)
WBC: 8.9 10*3/uL (ref 4.0–10.5)

## 2014-06-22 LAB — I-STAT TROPONIN, ED: Troponin i, poc: 0 ng/mL (ref 0.00–0.08)

## 2014-06-22 LAB — CBG MONITORING, ED: Glucose-Capillary: 100 mg/dL — ABNORMAL HIGH (ref 70–99)

## 2014-06-22 LAB — PROTIME-INR
INR: 0.97 (ref 0.00–1.49)
Prothrombin Time: 13 seconds (ref 11.6–15.2)

## 2014-06-22 LAB — APTT: APTT: 33 s (ref 24–37)

## 2014-06-22 MED ORDER — GABAPENTIN 100 MG PO CAPS: 100.0000 mg | ORAL_CAPSULE | Freq: Three times a day (TID) | ORAL | Status: DC

## 2014-06-22 NOTE — ED Notes (Signed)
Spoke with pt regarding CTA - pt declines CTA at this time. States that he will go to his scheduled exams on Wednesday. All questions answered.

## 2014-06-22 NOTE — ED Notes (Signed)
Pt here for left sided arm numbness that stared about 2 pm today and feeling dizzy. sts also some heartburn. sts recently dx with TIA. sts when he drinks water it burns going down.

## 2014-06-22 NOTE — Discharge Instructions (Signed)
Paresthesia Paresthesia is a burning or prickling feeling. This feeling can happen in any part of the body. It often happens in the hands, arms, legs, or feet. HOME CARE  Avoid drinking alcohol.  Try massage or needle therapy (acupuncture) to help with your problems.  Keep all doctor visits as told. GET HELP RIGHT AWAY IF:   You feel weak.  You have trouble walking or moving.  You have problems speaking or seeing.  You feel confused.  You cannot control when you poop (bowel movement) or pee (urinate).  You lose feeling (numbness) after an injury.  You pass out (faint).  Your burning or prickling feeling gets worse when you walk.  You have pain, cramps, or feel dizzy.  You have a rash. MAKE SURE YOU:   Understand these instructions.  Will watch your condition.  Will get help right away if you are not doing well or get worse. Document Released: 03/02/2008 Document Revised: 06/12/2011 Document Reviewed: 12/09/2010 [employer] Patient Information 2015 [employer]. This information is not intended to replace advice given to you by your health care provider. Make sure you discuss any questions you have with your health care provider.  

## 2014-06-22 NOTE — ED Provider Notes (Signed)
CSN: [id number]     Arrival date & time 06/22/14  1837 History   First MD Initiated Contact with Patient 06/22/14 2102     Chief Complaint  Patient presents with  . Numbness      HPI Pt here for left sided arm numbness that stared about 2 pm today and feeling dizzy. sts also some heartburn. sts recently dx with TIA. sts when he drinks water it burns going down.  Was seen here 2 days ago with L facial numbness.  Has carotid dopplers and echo scheduled for Wednesday Past Medical History  Diagnosis Date  . ANEMIA-IRON DEFICIENCY 11/05/2007  . ALLERGIC RHINITIS 06/07/2007  . Irritable bowel syndrome 02/04/2010  . Cervical spondylosis without myelopathy 06/07/2007  . MYOFASCIAL PAIN SYNDROME 06/07/2007  . Hyperlipidemia 12/13/2013   History reviewed. No pertinent past surgical history. Family History  Problem Relation Age of Onset  . Cancer Mother 44    Colon Cancer  . Heart disease Father   . Hyperlipidemia Brother   . Hypertension Other    History  Substance Use Topics  . Smoking status: Never Smoker   . Smokeless tobacco: Never Used  . Alcohol Use: No    Review of Systems  Cardiovascular: Negative for palpitations.  Gastrointestinal: Negative for nausea.      Allergies  Crestor  Home Medications   Prior to Admission medications   Medication Sig Start Date End Date Taking? Authorizing Provider  allopurinol (ZYLOPRIM) 100 MG tablet Take 1 tablet (100 mg total) by mouth at bedtime. 01/28/14  Yes Biagio Borg, MD  aspirin EC 325 MG tablet Take 1 tablet (325 mg total) by mouth daily. 06/17/14  Yes Biagio Borg, MD  atorvastatin (LIPITOR) 20 MG tablet Take 1 tablet (20 mg total) by mouth daily. 12/12/13 12/12/14 Yes Biagio Borg, MD   BP 147/90 mmHg  Pulse 70  Temp(Src) 97.6 F (36.4 C) (Oral)  Resp 15  Ht 5\' 8"  (1.727 m)  Wt 178 lb 3.2 oz (80.831 kg)  BMI 27.10 kg/m2  SpO2 98% Physical Exam Physical Exam  Nursing note and vitals reviewed. Constitutional: He is oriented  to person, place, and time. He appears well-developed and well-nourished. No distress.  HENT:  Head: Normocephalic and atraumatic.  Eyes: Pupils are equal, round, and reactive to light.  Neck: Normal range of motion.  Cardiovascular: Normal rate and intact distal pulses.   no rubs murmurs or gallops. Pulmonary/Chest: No respiratory distress.  Abdominal: Normal appearance. He exhibits no distension.  Musculoskeletal: Normal range of motion.  Neurological: He is alert and oriented to person, place, and time. No cranial nerve deficit.  no weakness.  No numbness. Skin: Skin is warm and dry. No rash noted.  Psychiatric: He has a normal mood and affect. His behavior is normal.   ED Course  Procedures (including critical care time) Labs Review Labs Reviewed  COMPREHENSIVE METABOLIC PANEL - Abnormal; Notable for the following:    Glucose, Bld 118 (*)    All other components within normal limits  CBG MONITORING, ED - Abnormal; Notable for the following:    Glucose-Capillary 100 (*)    All other components within normal limits  PROTIME-INR  APTT  CBC  DIFFERENTIAL  I-STAT TROPOININ, ED    Imaging Review No results found.   EKG Interpretation   Date/Time:  Monday June 22 2014 18:54:33 EDT Ventricular Rate:  73 PR Interval:  168 QRS Duration: 90 QT Interval:  360 QTC Calculation: 396 R Axis:  12 Text Interpretation:  Normal sinus rhythm Normal ECG Confirmed by Charlton Boule   MD, Symia Herdt ([id number]) on 06/22/2014 9:13:15 PM     Patient was seen by neurology.  He recommended a CT angiogram of the neck but the patient declined since he has a carotid Doppler and echo scheduled for Wednesday.  He is asymptomatic and remained asymptomatic since the event.  He started on aspirin.  He'll return if he has any new symptoms or return symptoms. MDM   Final diagnoses:  Left sided numbness        Leonard Schwartz, MD 06/22/14 2256

## 2014-06-22 NOTE — Consult Note (Signed)
Neurology Consultation Reason for Consult: Left-sided tingling Referring Physician: Audie Pinto, R  CC: Left-sided tingling  History is obtained from: Patient  HPI: David Arias is a 59 y.o. male with now 3 episodes of unusual symptoms. The first happened a little more than a week ago. He began having confusion, scotoma, and bilateral facial tingling. The symptoms resolved over the course of the day, but his wife states that he was still mildly confused even the next morning. He then had a photophobic headache and sense of being drained and tired that lasted throughout the weekend. He was evaluated by his PCP who wondered about TIA and put him on a full dose aspirin as well as a statin. He then had an episode of tingling of his face on the left side predominantly. He states like it feels like pins and needles and lasted for few hours. He sought care in the emergency room at that time and had an MRI which was normal. He then was feeling well until today at which point he had tingling of the left forearm progressing to the shoulder. He also feels abnormal when he swallows, like he can feel the water going down. He is currently back to baseline.  He denies any history of migraine or headaches.    ROS: A 14 point ROS was performed and is negative except as noted in the HPI.   Past Medical History  Diagnosis Date  . ANEMIA-IRON DEFICIENCY 11/05/2007  . ALLERGIC RHINITIS 06/07/2007  . Irritable bowel syndrome 02/04/2010  . Cervical spondylosis without myelopathy 06/07/2007  . MYOFASCIAL PAIN SYNDROME 06/07/2007  . Hyperlipidemia 12/13/2013    Family History: No history of migraine or stroke  Social History: Tob: Denies  Exam: Current vital signs: BP 133/77 mmHg  Pulse 73  Temp(Src) 97.6 F (36.4 C) (Oral)  Resp 17  Ht 5\' 8"  (1.727 m)  Wt 80.831 kg (178 lb 3.2 oz)  BMI 27.10 kg/m2  SpO2 96% Vital signs in last 24 hours: Temp:  [97.6 F (36.4 C)] 97.6 F (36.4 C) (03/21 1846) Pulse Rate:   [73-76] 73 (03/21 1952) Resp:  [17-18] 17 (03/21 1952) BP: (133-162)/(77-87) 133/77 mmHg (03/21 1952) SpO2:  [96 %] 96 % (03/21 1952) Weight:  [80.831 kg (178 lb 3.2 oz)] 80.831 kg (178 lb 3.2 oz) (03/21 1846)   Physical Exam  Constitutional: Appears well-developed and well-nourished.  Psych: Affect appropriate to situation Eyes: No scleral injection HENT: No OP obstrucion Head: Normocephalic.  Cardiovascular: Normal rate and regular rhythm.  Respiratory: Effort normal and breath sounds normal to anterior ascultation GI: Soft.  No distension. There is no tenderness.  Skin: WDI  Neuro: Mental Status: Patient is awake, alert, oriented to person, place, month, year, and situation. Patient is able to give a clear and coherent history. No signs of aphasia or neglect Cranial Nerves: II: Visual Fields are full. Pupils are equal, round, and reactive to light.   III,IV, VI: EOMI without ptosis or diploplia.  V: Facial sensation is symmetric to temperature VII: Facial movement is symmetric.  VIII: hearing is intact to voice X: Uvula elevates symmetrically XI: Shoulder shrug is symmetric. XII: tongue is midline without atrophy or fasciculations.  Motor: Tone is normal. Bulk is normal. 5/5 strength was present in all four extremities.  Sensory: Sensation is symmetric to light touch and temperature in the arms and legs. Cerebellar: FNF intact bilaterally     I have reviewed labs in epic and the results pertinent to this consultation are: Fasting  glucose from the 17th-95   I have reviewed the images obtained: MRI brain from 3/17-no acute findings  Impression: 59 year old male with transient neurological symptoms including scotoma, bilateral facial paresthesia followed by severe photophobic headache. The description of the initial event sounds very much consistent with migraine. The confusion especially lasted for more than 12 hours and I would expect MRI change if this was ischemic  in nature. Subsequent episodes have been positive symptoms, tingling. I suspect that these represent migraine aura rather than TIA.  If it does represent TIA, he has arguments started on antiplatelet therapy and statin by his PCP. No signs of intracranial atherosclerosis. The recurrent similar nature would argue strongly against cardiac cause. It would be reasonable to check for ICA stenosis with CT angiogram, but if this is not found then I do not think that admission would necessarily benefit him at this time and we are he has an echocardiogram scheduled as an outpatient.  Recommendations: 1) continue ASA and statin per PCP 2) CT angiogram of the neck 3) follow-up with neurology as an outpatient if CT angiogram does not show stenosis.   Roland Rack, MD [facility] [phone]  If 7pm- 7am, please page neurology on call as listed in [place].

## 2014-06-22 NOTE — ED Notes (Signed)
CBG 100 

## 2014-06-24 ENCOUNTER — Ambulatory Visit (HOSPITAL_COMMUNITY): Payer: BLUE CROSS/BLUE SHIELD | Attending: Cardiology | Admitting: Cardiology

## 2014-06-24 ENCOUNTER — Ambulatory Visit (HOSPITAL_BASED_OUTPATIENT_CLINIC_OR_DEPARTMENT_OTHER): Payer: BLUE CROSS/BLUE SHIELD | Admitting: Radiology

## 2014-06-24 DIAGNOSIS — G459 Transient cerebral ischemic attack, unspecified: Secondary | ICD-10-CM

## 2014-06-24 DIAGNOSIS — R42 Dizziness and giddiness: Secondary | ICD-10-CM | POA: Insufficient documentation

## 2014-06-24 DIAGNOSIS — R202 Paresthesia of skin: Secondary | ICD-10-CM

## 2014-06-24 DIAGNOSIS — R200 Anesthesia of skin: Secondary | ICD-10-CM | POA: Diagnosis not present

## 2014-06-24 DIAGNOSIS — I6523 Occlusion and stenosis of bilateral carotid arteries: Secondary | ICD-10-CM | POA: Diagnosis not present

## 2014-06-24 HISTORY — PX: OTHER SURGICAL HISTORY: SHX169

## 2014-06-24 HISTORY — PX: TRANSTHORACIC ECHOCARDIOGRAM: SHX275

## 2014-06-24 NOTE — Progress Notes (Signed)
Echocardiogram performed.  

## 2014-06-24 NOTE — Progress Notes (Signed)
Carotid duplex performed 

## 2014-07-06 ENCOUNTER — Telehealth: Payer: Self-pay | Admitting: Internal Medicine

## 2014-07-06 NOTE — Telephone Encounter (Signed)
Pt called in and would like results from test that he had a couple of weeks ago.  He is aware that Dr Jenny Reichmann is not in office today and ok with call back tomorrow

## 2014-07-17 ENCOUNTER — Telehealth: Payer: Self-pay | Admitting: Internal Medicine

## 2014-07-17 NOTE — Telephone Encounter (Signed)
Pt called back in about these results   Cell number is best

## 2014-07-29 ENCOUNTER — Ambulatory Visit (INDEPENDENT_AMBULATORY_CARE_PROVIDER_SITE_OTHER): Payer: BLUE CROSS/BLUE SHIELD | Admitting: Neurology

## 2014-07-29 ENCOUNTER — Encounter: Payer: Self-pay | Admitting: Neurology

## 2014-07-29 VITALS — BP 134/86 | HR 78 | Resp 20 | Ht 68.0 in | Wt 179.4 lb

## 2014-07-29 DIAGNOSIS — R404 Transient alteration of awareness: Secondary | ICD-10-CM | POA: Diagnosis not present

## 2014-07-29 NOTE — Patient Instructions (Signed)
I don't think you had a TIA.  Your tests and physical exam are fine.  I would go back to the 81mg  aspirin daily and restart cholesterol medication.

## 2014-07-29 NOTE — Progress Notes (Signed)
NEUROLOGY CONSULTATION NOTE  David Arias MRN: [id number] DOB: [birth year]  Referring provider: Dr. Jenny Reichmann Primary care provider: Dr. Jenny Reichmann  Reason for consult:  TIA  HISTORY OF PRESENT ILLNESS: David Arias is a 59 year old right-handed man with hyperlipidemia who presents for TIA.  Records, labs, echo and carotid doppler reports and MRI and MRA of brain reviewed.  In March, he felt ill while at work on a Friday.  He began to feel "fuzzy" with difficulty concentrating, diffuse throbbing headache and dizziness.  He went home and went to sleep.  He talked with his cousin and he had trouble concentrating and following what his cousin was saying.  He was able to understand his cousin and he was able to talk, however.  The next day, he felt a little fuzzy but then felt fine on Sunday and Monday.  On Tuesday, he went back to work and began to feel ill again.  He felt .  He saw his PCP, the following day on 06/17/14. A TIA was suspected.  ASA was increased to 325mg  daily and started on Lipitor.  He then presented to the ED the following day for recurrent symptoms, including non-focal numbness on either side of his body. EKG showed normal sinus rhythm.  MRI and MRA of the brain were performed, which revealed no acute stroke or intracranial stenosis.  He had an unremarkable exam and was subsequently discharged.  He presented to the ED again on 06/22/14 for dizziness, fuzzy feeling and possible non-focal numbness.  A CTA of the neck was recommended but patient declined as he had upcoming imaging scheduled.  He had a 2D echo performed on 06/24/14, which showed LVEF 55-60% and mild LV hypertrophy.  Carotid duplex performed on 06/25/14 showed bilateral heterogeneous plaque with no hemodynamically significant ICA stenosis (less than 40%).  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  He says on the day that the symptoms started, there was a leak and the alarms were going off all day.  There usually is  a vent that sucks out the gas, but he says it may not be working right.  He correlates every time he gets sick with when he goes back to that site.  He only had headache that first time and he has no history of migraines or headaches.  He has since refused to go back to that site and has been feeling well.  He had a lipid panel checked in September, which showed cholesterol of 257, triglycerides 226, HDL 42.2 and direct LDL of 192.5.  He has not been taking his statin therapy.  PAST MEDICAL HISTORY: Past Medical History  Diagnosis Date  . ANEMIA-IRON DEFICIENCY 11/05/2007  . ALLERGIC RHINITIS 06/07/2007  . Irritable bowel syndrome 02/04/2010  . Cervical spondylosis without myelopathy 06/07/2007  . MYOFASCIAL PAIN SYNDROME 06/07/2007  . Hyperlipidemia 12/13/2013    PAST SURGICAL HISTORY: No past surgical history on file.  MEDICATIONS: Current Outpatient Prescriptions on File Prior to Visit  Medication Sig Dispense Refill  . allopurinol (ZYLOPRIM) 100 MG tablet Take 1 tablet (100 mg total) by mouth at bedtime. (Patient not taking: Reported on 07/29/2014) 90 tablet 3  . aspirin EC 325 MG tablet Take 1 tablet (325 mg total) by mouth daily. (Patient not taking: Reported on 07/29/2014) 30 tablet 99  . atorvastatin (LIPITOR) 20 MG tablet Take 1 tablet (20 mg total) by mouth daily. (Patient not taking: Reported on 07/29/2014) 90 tablet 3  . gabapentin (NEURONTIN) 100 MG  capsule Take 1 capsule (100 mg total) by mouth 3 (three) times daily. (Patient not taking: Reported on 07/29/2014) 15 capsule 0   No current facility-administered medications on file prior to visit.    ALLERGIES: Allergies  Allergen Reactions  . Crestor [Rosuvastatin]     Joint pain    FAMILY HISTORY: Family History  Problem Relation Age of Onset  . Cancer Mother 69    Colon Cancer  . Heart disease Father   . Hyperlipidemia Brother   . Hypertension Other   . Heart failure Father     SOCIAL HISTORY: History   Social History   . Marital Status: Divorced    Spouse Name: N/A  . Number of Children: N/A  . Years of Education: N/A   Occupational History  . TECHNICIAN     engineer   Social History Main Topics  . Smoking status: Never Smoker   . Smokeless tobacco: Never Used  . Alcohol Use: No  . Drug Use: No  . Sexual Activity:    Partners: Male   Other Topics Concern  . Not on file   Social History Narrative    REVIEW OF SYSTEMS: Constitutional: No fevers, chills, or sweats, no generalized fatigue, change in appetite Eyes: No visual changes, double vision, eye pain Ear, nose and throat: No hearing loss, ear pain, nasal congestion, sore throat Cardiovascular: No chest pain, palpitations Respiratory:  No shortness of breath at rest or with exertion, wheezes GastrointestinaI: No nausea, vomiting, diarrhea, abdominal pain, fecal incontinence Genitourinary:  No dysuria, urinary retention or frequency Musculoskeletal:  No neck pain, back pain Integumentary: No rash, pruritus, skin lesions Neurological: as above Psychiatric: No depression, insomnia, anxiety Endocrine: No palpitations, fatigue, diaphoresis, mood swings, change in appetite, change in weight, increased thirst Hematologic/Lymphatic:  No anemia, purpura, petechiae. Allergic/Immunologic: no itchy/runny eyes, nasal congestion, recent allergic reactions, rashes  PHYSICAL EXAM: Filed Vitals:   07/29/14 0818  BP: 134/86  Pulse: 78  Resp: 20   General: No acute distress Head:  Normocephalic/atraumatic Eyes:  fundi unremarkable, without vessel changes, exudates, hemorrhages or papilledema. Neck: supple, no paraspinal tenderness, full range of motion Back: No paraspinal tenderness Heart: regular rate and rhythm Lungs: Clear to auscultation bilaterally. Vascular: No carotid bruits. Neurological Exam: Mental status: alert and oriented to person, place, and time, recent and remote memory intact, fund of knowledge intact, attention and  concentration intact, speech fluent and not dysarthric, language intact. Cranial nerves: CN I: not tested CN II: pupils equal, round and reactive to light, visual fields intact, fundi unremarkable, without vessel changes, exudates, hemorrhages or papilledema. CN III, IV, VI:  full range of motion, no nystagmus, no ptosis CN V: facial sensation intact CN VII: upper and lower face symmetric CN VIII: hearing intact CN IX, X: gag intact, uvula midline CN XI: sternocleidomastoid and trapezius muscles intact CN XII: tongue midline Bulk & Tone: normal, no fasciculations. Motor:  5/5 throughout Sensation:  Temperature and vibration intact Deep Tendon Reflexes:  2+ throughout, toes downgoing Finger to nose testing:  No dysmetria Heel to shin:  No dysmetria Gait:  Normal station and stride.  Able to turn and walk in tandem. Romberg negative.  IMPRESSION: Recurrent episodes of dizziness and illness.  I don't believe these are TIAs.  He has not exhibited any focal findings.  He did not have aphasia or focal weakness.  He had reported numbness as per the ED note, however he says it was mild and it was not focal, occuring on  either side of his body.  It sounds like he was experiencing some unspecified illness.  He mentioned the freon exposure.  I cannot say for sure if that is related but I think his symptoms were due to some other etiology.  PLAN: 1.  I would decrease aspirin back to 81mg  daily 2.  I recommended that he resume statin therapy 3.  No follow up warranted.  Thank you for allowing me to take part in the care of this patient.  Metta Clines, DO  CC:  Cathlean Cower, MD

## 2015-02-19 ENCOUNTER — Other Ambulatory Visit (INDEPENDENT_AMBULATORY_CARE_PROVIDER_SITE_OTHER): Payer: BLUE CROSS/BLUE SHIELD

## 2015-02-19 ENCOUNTER — Encounter: Payer: Self-pay | Admitting: Internal Medicine

## 2015-02-19 ENCOUNTER — Ambulatory Visit (INDEPENDENT_AMBULATORY_CARE_PROVIDER_SITE_OTHER): Payer: BLUE CROSS/BLUE SHIELD | Admitting: Internal Medicine

## 2015-02-19 VITALS — BP 128/76 | HR 81 | Temp 98.4°F | Ht 68.0 in | Wt 176.0 lb

## 2015-02-19 DIAGNOSIS — M109 Gout, unspecified: Secondary | ICD-10-CM | POA: Diagnosis not present

## 2015-02-19 DIAGNOSIS — Z0000 Encounter for general adult medical examination without abnormal findings: Secondary | ICD-10-CM

## 2015-02-19 DIAGNOSIS — R1030 Lower abdominal pain, unspecified: Secondary | ICD-10-CM

## 2015-02-19 DIAGNOSIS — Z0189 Encounter for other specified special examinations: Secondary | ICD-10-CM

## 2015-02-19 DIAGNOSIS — R109 Unspecified abdominal pain: Secondary | ICD-10-CM | POA: Insufficient documentation

## 2015-02-19 LAB — URINALYSIS, ROUTINE W REFLEX MICROSCOPIC
Bilirubin Urine: NEGATIVE
KETONES UR: NEGATIVE
LEUKOCYTES UA: NEGATIVE
Nitrite: NEGATIVE
Specific Gravity, Urine: <=1.005 — AB (ref 1.000–1.030)
Total Protein, Urine: NEGATIVE
URINE GLUCOSE: NEGATIVE
UROBILINOGEN UA: 0.2 (ref 0.0–1.0)
pH: 7 (ref 5.0–8.0)

## 2015-02-19 LAB — LIPID PANEL
Cholesterol: 237 mg/dL — ABNORMAL HIGH (ref 0–200)
HDL: 50.8 mg/dL (ref >39.00)
LDL CALC: 155 mg/dL — AB (ref 0–99)
NonHDL: 186.56
Total CHOL/HDL Ratio: 5
Triglycerides: 158 mg/dL — ABNORMAL HIGH (ref 0.0–149.0)
VLDL: 31.6 mg/dL (ref 0.0–40.0)

## 2015-02-19 LAB — CBC WITH DIFFERENTIAL/PLATELET
BASOS ABS: 0.1 10*3/uL (ref 0.0–0.1)
Basophils Relative: 0.8 % (ref 0.0–3.0)
Eosinophils Absolute: 0.2 10*3/uL (ref 0.0–0.7)
Eosinophils Relative: 2.6 % (ref 0.0–5.0)
HEMATOCRIT: 44.3 % (ref 39.0–52.0)
HEMOGLOBIN: 14.5 g/dL (ref 13.0–17.0)
LYMPHS ABS: 2.4 10*3/uL (ref 0.7–4.0)
LYMPHS PCT: 26.8 % (ref 12.0–46.0)
MCHC: 32.8 g/dL (ref 30.0–36.0)
MCV: 84.2 fl (ref 78.0–100.0)
MONOS PCT: 9.6 % (ref 3.0–12.0)
Monocytes Absolute: 0.9 10*3/uL (ref 0.1–1.0)
Neutro Abs: 5.4 10*3/uL (ref 1.4–7.7)
Neutrophils Relative %: 60.2 % (ref 43.0–77.0)
Platelets: 312 10*3/uL (ref 150.0–400.0)
RBC: 5.26 Mil/uL (ref 4.22–5.81)
RDW: 13.4 % (ref 11.5–15.5)
WBC: 9.1 10*3/uL (ref 4.0–10.5)

## 2015-02-19 LAB — BASIC METABOLIC PANEL
BUN: 13 mg/dL (ref 6–23)
CALCIUM: 9.9 mg/dL (ref 8.4–10.5)
CO2: 33 mEq/L — ABNORMAL HIGH (ref 19–32)
CREATININE: 1.14 mg/dL (ref 0.40–1.50)
Chloride: 101 mEq/L (ref 96–112)
GFR: 69.57 mL/min (ref >60.00)
GLUCOSE: 93 mg/dL (ref 70–99)
Potassium: 3.9 mEq/L (ref 3.5–5.1)
SODIUM: 140 meq/L (ref 135–145)

## 2015-02-19 LAB — HEPATIC FUNCTION PANEL
ALBUMIN: 4.5 g/dL (ref 3.5–5.2)
ALK PHOS: 91 U/L (ref 39–117)
ALT: 29 U/L (ref 0–53)
AST: 18 U/L (ref 0–37)
Bilirubin, Direct: 0.1 mg/dL (ref 0.0–0.3)
Total Bilirubin: 0.5 mg/dL (ref 0.2–1.2)
Total Protein: 7.4 g/dL (ref 6.0–8.3)

## 2015-02-19 LAB — PSA: PSA: 0.65 ng/mL (ref 0.10–4.00)

## 2015-02-19 LAB — URIC ACID: Uric Acid, Serum: 7.5 mg/dL (ref 4.0–7.8)

## 2015-02-19 LAB — TSH: TSH: 0.98 u[IU]/mL (ref 0.35–4.50)

## 2015-02-19 LAB — LIPASE: Lipase: 28 U/L (ref 11.0–59.0)

## 2015-02-19 MED ORDER — DOXYCYCLINE HYCLATE 100 MG PO TABS: 100.0000 mg | ORAL_TABLET | Freq: Two times a day (BID) | ORAL | Status: DC

## 2015-02-19 MED ORDER — CIPROFLOXACIN HCL 500 MG PO TABS: 500.0000 mg | ORAL_TABLET | Freq: Two times a day (BID) | ORAL | Status: DC

## 2015-02-19 NOTE — Progress Notes (Signed)
Subjective:    Patient ID: David Arias, male    DOB: [birth year], 59 y.o.   MRN: [id number]  HPI  Here to f/u with c/o 3 wks mild to mod constant achy pain to the bilat lower abd/pelvis, seems to somewhat wax and wane, ? Some improvement with BM, but most striking is urinary freq at least twice per day more than usual, with mild urgency and some discomfort noted at the left groin, can seem to radiate to the lower back, but Denies urinary symptoms such as dysuria, flank pain, hematuria or n/v, fever, chills. Pt denies chest pain, increased sob or doe, wheezing, orthopnea, PND, increased LE swelling, palpitations, dizziness or syncope.  Pt denies new neurological symptoms such as new headache, or facial or extremity weakness or numbness   Past Medical History  Diagnosis Date  . ANEMIA-IRON DEFICIENCY 11/05/2007  . ALLERGIC RHINITIS 06/07/2007  . Irritable bowel syndrome 02/04/2010  . Cervical spondylosis without myelopathy 06/07/2007  . MYOFASCIAL PAIN SYNDROME 06/07/2007  . Hyperlipidemia 12/13/2013   No past surgical history on file.  reports that he has never smoked. He has never used smokeless tobacco. He reports that he does not drink alcohol or use illicit drugs. family history includes Cancer (age of onset: 11) in his mother; Heart disease in his father; Heart failure in his father; Hyperlipidemia in his brother; Hypertension in his other. Allergies  Allergen Reactions  . Crestor [Rosuvastatin]     Joint pain   Current Outpatient Prescriptions on File Prior to Visit  Medication Sig Dispense Refill  . allopurinol (ZYLOPRIM) 100 MG tablet Take 1 tablet (100 mg total) by mouth at bedtime. 90 tablet 3  . aspirin EC 325 MG tablet Take 1 tablet (325 mg total) by mouth daily. 30 tablet 99  . gabapentin (NEURONTIN) 100 MG capsule Take 1 capsule (100 mg total) by mouth 3 (three) times daily. 15 capsule 0  . atorvastatin (LIPITOR) 20 MG tablet Take 1 tablet (20 mg total) by mouth daily. (Patient not  taking: Reported on 07/29/2014) 90 tablet 3   No current facility-administered medications on file prior to visit.   Review of Systems  Constitutional: Negative for unusual diaphoresis or night sweats HENT: Negative for ringing in ear or discharge Eyes: Negative for double vision or worsening visual disturbance.  Respiratory: Negative for choking and stridor.   Gastrointestinal: Negative for vomiting or other signifcant bowel change Genitourinary: Negative for hematuria or change in urine volume.  Musculoskeletal: Negative for other MSK pain or swelling Skin: Negative for color change and worsening wound.  Neurological: Negative for tremors and numbness other than noted  Psychiatric/Behavioral: Negative for decreased concentration or agitation other than above       Objective:   Physical Exam BP 128/76 mmHg  Pulse 81  Temp(Src) 98.4 F (36.9 C) (Oral)  Ht 5\' 8"  (1.727 m)  Wt 176 lb (79.833 kg)  BMI 26.77 kg/m2  SpO2 97% VS noted, mild uncomfortable appearing, nontoxic Constitutional: Pt appears in no significant distress HENT: Head: NCAT.  Right Ear: External ear normal.  Left Ear: External ear normal.  Eyes: . Pupils are equal, round, and reactive to light. Conjunctivae and EOM are normal Neck: Normal range of motion. Neck supple.  Cardiovascular: Normal rate and regular rhythm.   Pulmonary/Chest: Effort normal and breath sounds without rales or wheezing.  Abd:  Soft, NT, ND, + BS Spine nontender DRE deferred per pt Neurological: Pt is alert. Not confused , motor 5/5 intact, gait intact  Skin: Skin is warm. No rash, no LE edema Psychiatric: Pt behavior is normal. No agitation.     Assessment & Plan:

## 2015-02-19 NOTE — Patient Instructions (Addendum)
Please take all new medication as prescribed - the antibiotic  Please continue all other medications as before, and refills have been done if requested.  Please have the pharmacy call with any other refills you may need.  Please continue your efforts at being more active, low cholesterol diet, and weight control.  You are otherwise up to date with prevention measures today.  Please keep your appointments with your specialists as you may have planned  Please go to the LAB in the Basement (turn left off the elevator) for the tests to be done today  You will be contacted by phone if any changes need to be made immediately.  Otherwise, you will receive a letter about your results with an explanation, but please check with MyChart first.  Please remember to sign up for MyChart if you have not done so, as this will be important to you in the future with finding out test results, communicating by private email, and scheduling acute appointments online when needed.  OK to make return office visit as CPX (physical) in the next 2-3 weeks - ok to see schedulers as you leave

## 2015-02-19 NOTE — Progress Notes (Signed)
Pre visit review using our clinic review tool, if applicable. No additional management support is needed unless otherwise documented below in the visit note. 

## 2015-02-20 NOTE — Assessment & Plan Note (Signed)
Stable, also for uric acid level with labs for cpx next visit,  to f/u any worsening symptoms or concerns

## 2015-02-20 NOTE — Assessment & Plan Note (Signed)
Hx and exam c/w acute prostatitis, for urine studies, empiric cipro x 3 wks,  to f/u any worsening symptoms or concerns

## 2015-02-21 LAB — URINE CULTURE: Colony Count: 1000

## 2015-03-02 ENCOUNTER — Encounter: Payer: Self-pay | Admitting: Internal Medicine

## 2015-03-02 ENCOUNTER — Ambulatory Visit (INDEPENDENT_AMBULATORY_CARE_PROVIDER_SITE_OTHER): Payer: BLUE CROSS/BLUE SHIELD | Admitting: Internal Medicine

## 2015-03-02 VITALS — BP 118/76 | HR 80 | Temp 98.6°F | Ht 68.0 in | Wt 179.0 lb

## 2015-03-02 DIAGNOSIS — Z800 Family history of malignant neoplasm of digestive organs: Secondary | ICD-10-CM

## 2015-03-02 DIAGNOSIS — Z0000 Encounter for general adult medical examination without abnormal findings: Secondary | ICD-10-CM | POA: Diagnosis not present

## 2015-03-02 MED ORDER — ATORVASTATIN CALCIUM 20 MG PO TABS: 20.0000 mg | ORAL_TABLET | Freq: Every day | ORAL | Status: DC

## 2015-03-02 NOTE — Assessment & Plan Note (Signed)

## 2015-03-02 NOTE — Progress Notes (Signed)
Pre visit review using our clinic review tool, if applicable. No additional management support is needed unless otherwise documented below in the visit note. 

## 2015-03-02 NOTE — Progress Notes (Signed)
Subjective:    Patient ID: David Arias, male    DOB: [birth year], 59 y.o.   MRN: [id number]  HPI  Here for wellness and f/u;  Overall doing ok;  Pt denies Chest pain, worsening SOB, DOE, wheezing, orthopnea, PND, worsening LE edema, palpitations, dizziness or syncope.  Pt denies neurological change such as new headache, facial or extremity weakness.  Pt denies polydipsia, polyuria, or low sugar symptoms. Pt states overall good compliance with treatment and medications, good tolerability, and has been trying to follow appropriate diet.  Pt denies worsening depressive symptoms, suicidal ideation or panic. No fever, night sweats, wt loss, loss of appetite, or other constitutional symptoms.  Pt states good ability with ADL's, has low fall risk, home safety reviewed and adequate, no other significant changes in hearing or vision, and only occasionally active with exercise.  Due for colonnscopy mar 2017 with FH colon ca (mother).  Willing to re-try the lipitor but mayonly take qod to avoid myalgias.   Past Medical History  Diagnosis Date  . ANEMIA-IRON DEFICIENCY 11/05/2007  . ALLERGIC RHINITIS 06/07/2007  . Irritable bowel syndrome 02/04/2010  . Cervical spondylosis without myelopathy 06/07/2007  . MYOFASCIAL PAIN SYNDROME 06/07/2007  . Hyperlipidemia 12/13/2013   No past surgical history on file.  reports that he has never smoked. He has never used smokeless tobacco. He reports that he does not drink alcohol or use illicit drugs. family history includes Cancer (age of onset: 50) in his mother; Heart disease in his father; Heart failure in his father; Hyperlipidemia in his brother; Hypertension in his other. Allergies  Allergen Reactions  . Crestor [Rosuvastatin]     Joint pain   Current Outpatient Prescriptions on File Prior to Visit  Medication Sig Dispense Refill  . allopurinol (ZYLOPRIM) 100 MG tablet Take 1 tablet (100 mg total) by mouth at bedtime. 90 tablet 3  . aspirin EC 325 MG tablet Take 1  tablet (325 mg total) by mouth daily. 30 tablet 99  . ciprofloxacin (CIPRO) 500 MG tablet Take 1 tablet (500 mg total) by mouth 2 (two) times daily. 42 tablet 0  . gabapentin (NEURONTIN) 100 MG capsule Take 1 capsule (100 mg total) by mouth 3 (three) times daily. 15 capsule 0   No current facility-administered medications on file prior to visit.   Review of Systems Constitutional: Negative for increased diaphoresis, other activity, appetite or siginficant weight change other than noted HENT: Negative for worsening hearing loss, ear pain, facial swelling, mouth sores and neck stiffness.   Eyes: Negative for other worsening pain, redness or visual disturbance.  Respiratory: Negative for shortness of breath and wheezing  Cardiovascular: Negative for chest pain and palpitations.  Gastrointestinal: Negative for diarrhea, blood in stool, abdominal distention or other pain Genitourinary: Negative for hematuria, flank pain or change in urine volume.  Musculoskeletal: Negative for myalgias or other joint complaints.  Skin: Negative for color change and wound or drainage.  Neurological: Negative for syncope and numbness. other than noted Hematological: Negative for adenopathy. or other swelling Psychiatric/Behavioral: Negative for hallucinations, SI, self-injury, decreased concentration or other worsening agitation.      Objective:   Physical Exam BP 118/76 mmHg  Pulse 80  Temp(Src) 98.6 F (37 C) (Oral)  Ht 5\' 8"  (1.727 m)  Wt 179 lb (81.194 kg)  BMI 27.22 kg/m2  SpO2 97% VS noted,  Constitutional: Pt is oriented to person, place, and time. Appears well-developed and well-nourished, in no significant distress Head: Normocephalic and atraumatic.  Right  Ear: External ear normal.  Left Ear: External ear normal.  Nose: Nose normal.  Mouth/Throat: Oropharynx is clear and moist.  Eyes: Conjunctivae and EOM are normal. Pupils are equal, round, and reactive to light.  Neck: Normal range of  motion. Neck supple. No JVD present. No tracheal deviation present or significant neck LA or mass Cardiovascular: Normal rate, regular rhythm, normal heart sounds and intact distal pulses.   Pulmonary/Chest: Effort normal and breath sounds without rales or wheezing  Abdominal: Soft. Bowel sounds are normal. NT. No HSM  Musculoskeletal: Normal range of motion. Exhibits no edema.  Lymphadenopathy:  Has no cervical adenopathy.  Neurological: Pt is alert and oriented to person, place, and time. Pt has normal reflexes. No cranial nerve deficit. Motor grossly intact Skin: Skin is warm and dry. No rash noted.  Psychiatric:  Has normal mood and affect. Behavior is normal.     Assessment & Plan:

## 2015-03-02 NOTE — Patient Instructions (Addendum)
Please continue all other medications as before, and refills have been done if requested.  Please have the pharmacy call with any other refills you may need.  Please continue your efforts at being more active, low cholesterol diet, and weight control.  You are otherwise up to date with prevention measures today.  Please keep your appointments with your specialists as you may have planned  You will be contacted regarding the referral for: colonoscopy  Please return in 1 year for your yearly visit, or sooner if needed, with Lab testing done 3-5 days before  

## 2015-03-30 ENCOUNTER — Other Ambulatory Visit (INDEPENDENT_AMBULATORY_CARE_PROVIDER_SITE_OTHER): Payer: BLUE CROSS/BLUE SHIELD

## 2015-03-30 ENCOUNTER — Encounter: Payer: Self-pay | Admitting: Internal Medicine

## 2015-03-30 ENCOUNTER — Ambulatory Visit (INDEPENDENT_AMBULATORY_CARE_PROVIDER_SITE_OTHER): Payer: BLUE CROSS/BLUE SHIELD | Admitting: Internal Medicine

## 2015-03-30 VITALS — BP 116/80 | HR 79 | Temp 98.1°F | Ht 68.0 in | Wt 179.0 lb

## 2015-03-30 DIAGNOSIS — R21 Rash and other nonspecific skin eruption: Secondary | ICD-10-CM | POA: Diagnosis not present

## 2015-03-30 DIAGNOSIS — R1030 Lower abdominal pain, unspecified: Secondary | ICD-10-CM | POA: Diagnosis not present

## 2015-03-30 LAB — CBC WITH DIFFERENTIAL/PLATELET
BASOS PCT: 0.5 % (ref 0.0–3.0)
Basophils Absolute: 0 10*3/uL (ref 0.0–0.1)
EOS ABS: 0.4 10*3/uL (ref 0.0–0.7)
Eosinophils Relative: 4.4 % (ref 0.0–5.0)
HCT: 41.6 % (ref 39.0–52.0)
Hemoglobin: 14 g/dL (ref 13.0–17.0)
Lymphocytes Relative: 28.4 % (ref 12.0–46.0)
Lymphs Abs: 2.4 10*3/uL (ref 0.7–4.0)
MCHC: 33.7 g/dL (ref 30.0–36.0)
MCV: 82.7 fl (ref 78.0–100.0)
MONO ABS: 0.8 10*3/uL (ref 0.1–1.0)
Monocytes Relative: 9.5 % (ref 3.0–12.0)
NEUTROS ABS: 4.9 10*3/uL (ref 1.4–7.7)
Neutrophils Relative %: 57.2 % (ref 43.0–77.0)
PLATELETS: 317 10*3/uL (ref 150.0–400.0)
RBC: 5.03 Mil/uL (ref 4.22–5.81)
RDW: 13.2 % (ref 11.5–15.5)
WBC: 8.6 10*3/uL (ref 4.0–10.5)

## 2015-03-30 LAB — BASIC METABOLIC PANEL
BUN: 14 mg/dL (ref 6–23)
CHLORIDE: 101 meq/L (ref 96–112)
CO2: 29 meq/L (ref 19–32)
CREATININE: 1.03 mg/dL (ref 0.40–1.50)
Calcium: 9.5 mg/dL (ref 8.4–10.5)
GFR: 78.19 mL/min (ref >60.00)
Glucose, Bld: 80 mg/dL (ref 70–99)
Potassium: 3.8 mEq/L (ref 3.5–5.1)
Sodium: 139 mEq/L (ref 135–145)

## 2015-03-30 LAB — URINALYSIS, ROUTINE W REFLEX MICROSCOPIC
BILIRUBIN URINE: NEGATIVE
KETONES UR: NEGATIVE
Leukocytes, UA: NEGATIVE
Nitrite: NEGATIVE
PH: 5.5 (ref 5.0–8.0)
Specific Gravity, Urine: 1.025 (ref 1.000–1.030)
TOTAL PROTEIN, URINE-UPE24: NEGATIVE
UROBILINOGEN UA: 0.2 (ref 0.0–1.0)
Urine Glucose: NEGATIVE

## 2015-03-30 LAB — LIPASE: Lipase: 20 U/L (ref 11.0–59.0)

## 2015-03-30 MED ORDER — DOXYCYCLINE HYCLATE 100 MG PO TABS: 100.0000 mg | ORAL_TABLET | Freq: Two times a day (BID) | ORAL | Status: DC

## 2015-03-30 NOTE — Assessment & Plan Note (Addendum)
Etiology unclear, suspect prostatitis recurrent but cant r/o other, for labs including cbc and Urine cx, empiric doxy course, CT abd with CM r/o malignancy, and refer Dr Fuller Plan (due for colonscopy feb 2017 as well)  Note:  Total time for pt hx, exam, review of record with pt in the room, determination of diagnoses and plan for further eval and tx is > 40 min, with over 50% spent in coordination and counseling of patient

## 2015-03-30 NOTE — Assessment & Plan Note (Signed)
Suspect recent incidental shingles now improved,  to f/u any worsening symptoms or concerns

## 2015-03-30 NOTE — Patient Instructions (Signed)
Please take all new medication as prescribed - the antibiotic  Please continue all other medications as before, and refills have been done if requested.  Please have the pharmacy call with any other refills you may need.  Please continue your efforts at being more active, low cholesterol diet, and weight control.  Please keep your appointments with your specialists as you may have planned  You will be contacted regarding the referral for: CT abdomen/pelvis, ad Dr Fuller Plan  Please go to the LAB in the Basement (turn left off the elevator) for the tests to be done today  You will be contacted by phone if any changes need to be made immediately.  Otherwise, you will receive a letter about your results with an explanation, but please check with MyChart first.  Please remember to sign up for MyChart if you have not done so, as this will be important to you in the future with finding out test results, communicating by private email, and scheduling acute appointments online when needed.

## 2015-03-30 NOTE — Progress Notes (Signed)
Pre visit review using our clinic review tool, if applicable. No additional management support is needed unless otherwise documented below in the visit note. 

## 2015-03-30 NOTE — Progress Notes (Signed)
Subjective:    Patient ID: David Arias, male    DOB: 1955/08/20, 59 y.o.   MRN: [id number]  HPI  Here with lower abd pain, similar to episode nov 18, but some slight more severe, lower bilat quads, dull, achy assoc with abd bloating and gas but Denies worsening reflux, other abd pain, dysphagia, n/v, bowel change or blood.   Pt denies fever, wt loss, night sweats, loss of appetite, or other constitutional symptoms  Seemed to improve with cipro x 3 wks, now worse again.  Recent urine cx neg, as well as cbc.  Incidentally with episode herpetic rash right groin 8 days ago, now crusted and improved, and not seeminly assoc with the pain of concern  Due for f/u colonscopy feb 2017 with Dr Fuller Plan.  Currently not taking any of his meds listed to not cause any side effects Past Medical History  Diagnosis Date  . ANEMIA-IRON DEFICIENCY 11/05/2007  . ALLERGIC RHINITIS 06/07/2007  . Irritable bowel syndrome 02/04/2010  . Cervical spondylosis without myelopathy 06/07/2007  . MYOFASCIAL PAIN SYNDROME 06/07/2007  . Hyperlipidemia 12/13/2013   No past surgical history on file.  reports that he has never smoked. He has never used smokeless tobacco. He reports that he does not drink alcohol or use illicit drugs. family history includes Cancer (age of onset: 53) in his mother; Heart disease in his father; Heart failure in his father; Hyperlipidemia in his brother; Hypertension in his other. Allergies  Allergen Reactions  . Crestor [Rosuvastatin]     Joint pain   Current Outpatient Prescriptions on File Prior to Visit  Medication Sig Dispense Refill  . allopurinol (ZYLOPRIM) 100 MG tablet Take 1 tablet (100 mg total) by mouth at bedtime. (Patient not taking: Reported on 03/30/2015) 90 tablet 3  . aspirin EC 325 MG tablet Take 1 tablet (325 mg total) by mouth daily. (Patient not taking: Reported on 03/30/2015) 30 tablet 99  . atorvastatin (LIPITOR) 20 MG tablet Take 1 tablet (20 mg total) by mouth daily. (Patient not  taking: Reported on 03/30/2015) 90 tablet 3  . ciprofloxacin (CIPRO) 500 MG tablet Take 1 tablet (500 mg total) by mouth 2 (two) times daily. (Patient not taking: Reported on 03/30/2015) 42 tablet 0  . gabapentin (NEURONTIN) 100 MG capsule Take 1 capsule (100 mg total) by mouth 3 (three) times daily. (Patient not taking: Reported on 03/30/2015) 15 capsule 0   No current facility-administered medications on file prior to visit.   Review of Systems  Constitutional: Negative for unusual diaphoresis or night sweats HENT: Negative for ringing in ear or discharge Eyes: Negative for double vision or worsening visual disturbance.  Respiratory: Negative for choking and stridor.   Gastrointestinal: Negative for vomiting or other signifcant bowel change Genitourinary: Negative for hematuria or change in urine volume.  Musculoskeletal: Negative for other MSK pain or swelling Skin: Negative for color change and worsening wound.  Neurological: Negative for tremors and numbness other than noted  Psychiatric/Behavioral: Negative for decreased concentration or agitation other than above       Objective:   Physical Exam BP 116/80 mmHg  Pulse 79  Temp(Src) 98.1 F (36.7 C) (Oral)  Ht 5\' 8"  (1.727 m)  Wt 179 lb (81.194 kg)  BMI 27.22 kg/m2  SpO2 98% VS noted,  Constitutional: Pt appears in no significant distress HENT: Head: NCAT.  Right Ear: External ear normal.  Left Ear: External ear normal.  Eyes: . Pupils are equal, round, and reactive to light. Conjunctivae and  EOM are normal Neck: Normal range of motion. Neck supple.  Cardiovascular: Normal rate and regular rhythm.   Pulmonary/Chest: Effort normal and breath sounds without rales or wheezing.  Abd:  Soft, NT, ND, + BS NT DRE: deferred urological: Pt is alert. Not confused , motor grossly intact Skin: Skin is warm. No rash, no LE edema Psychiatric: Pt behavior is normal. No agitation.     Assessment & Plan:

## 2015-03-31 LAB — URINE CULTURE
COLONY COUNT: NO GROWTH
Organism ID, Bacteria: NO GROWTH

## 2015-04-02 ENCOUNTER — Ambulatory Visit (INDEPENDENT_AMBULATORY_CARE_PROVIDER_SITE_OTHER)
Admission: RE | Admit: 2015-04-02 | Discharge: 2015-04-02 | Disposition: A | Discharge status: Home / Self Care | Payer: BLUE CROSS/BLUE SHIELD | Source: Ambulatory Visit | Attending: Internal Medicine | Admitting: Internal Medicine

## 2015-04-02 DIAGNOSIS — R1030 Lower abdominal pain, unspecified: Secondary | ICD-10-CM | POA: Diagnosis not present

## 2015-04-02 MED ORDER — IOHEXOL 300 MG/ML  SOLN
100.0000 mL | Freq: Once | INTRAMUSCULAR | Status: AC | PRN
  Administered 2015-04-02: 100 mL via INTRAVENOUS

## 2015-04-12 ENCOUNTER — Telehealth: Payer: Self-pay | Admitting: Internal Medicine

## 2015-04-12 NOTE — Telephone Encounter (Signed)
Patient called  CT requesting the results of his scan. Per Glouster @ CT, pt stated, "No one ever calls me back from Dr. Gwynn Burly office." Please call pt with results. Thank you.

## 2015-04-12 NOTE — Telephone Encounter (Signed)
Pt informed of results. Pt is continuing abx treatment.

## 2015-05-11 ENCOUNTER — Ambulatory Visit (INDEPENDENT_AMBULATORY_CARE_PROVIDER_SITE_OTHER): Payer: BLUE CROSS/BLUE SHIELD | Admitting: Gastroenterology

## 2015-05-11 ENCOUNTER — Encounter: Payer: Self-pay | Admitting: Gastroenterology

## 2015-05-11 VITALS — BP 120/74 | HR 64 | Ht 67.75 in | Wt 181.4 lb

## 2015-05-11 DIAGNOSIS — K589 Irritable bowel syndrome without diarrhea: Secondary | ICD-10-CM

## 2015-05-11 DIAGNOSIS — R1030 Lower abdominal pain, unspecified: Secondary | ICD-10-CM | POA: Diagnosis not present

## 2015-05-11 DIAGNOSIS — Z800 Family history of malignant neoplasm of digestive organs: Secondary | ICD-10-CM | POA: Diagnosis not present

## 2015-05-11 MED ORDER — HYOSCYAMINE SULFATE 0.125 MG SL SUBL: SUBLINGUAL_TABLET | SUBLINGUAL | Status: DC

## 2015-05-11 MED ORDER — NA SULFATE-K SULFATE-MG SULF 17.5-3.13-1.6 GM/177ML PO SOLN: 1.0000 | Freq: Once | ORAL | Status: DC

## 2015-05-11 NOTE — Patient Instructions (Signed)
We have sent the following medications to your pharmacy for you to pick up at your convenience:Levsin.  You can take over the counter Gas-X four times a day as needed for gas and bloating.  You have been scheduled for a colonoscopy. Please follow written instructions given to you at your visit today.  Please pick up your prep supplies at the pharmacy within the next 1-3 days. If you use inhalers (even only as needed), please bring them with you on the day of your procedure. Your physician has requested that you go to www.startemmi.com and enter the access code given to you at your visit today. This web site gives a general overview about your procedure. However, you should still follow specific instructions given to you by our office regarding your preparation for the procedure.  Thank you for choosing me and [facility].  Pricilla Riffle. Dagoberto Ligas., MD., Marval Regal

## 2015-05-11 NOTE — Progress Notes (Signed)
    History of Present Illness: This is a 60 year old male complaining of frequent, achy lower abdominal pain. He was evaluated by Dr. Jenny Reichmann underwent a CT scan of the abdomen and pelvis showing mild colonic diverticulosis. He was treated with 2 courses of antibiotics for prostatitis and his symptoms have improved but have not abated. He has had a long history of alternating diarrhea and constipation. He has frequent abdominal bloating and has noted increased gas. He occasionally notes his abdominal discomfort is improved after a bowel movement. He underwent colonoscopy in February 2012 showing only internal hemorrhoids. He has occasional reflux symptoms and has a history of erosive esophagitis but no longer takes medication. He states adjusting his diet has relieved his reflux symptoms. Denies weight loss, change in stool caliber, melena, hematochezia, nausea, vomiting, dysphagia, reflux symptoms, chest pain.   IMPRESSION: 1. No acute abnormality. 2. Mild colonic diverticulosis.  Current Medications, Allergies, Past Medical History, Past Surgical History, Family History and Social History were reviewed in [employer] record.  Physical Exam: General: Well developed, well nourished, no acute distress Head: Normocephalic and atraumatic Eyes:  sclerae anicteric, EOMI Ears: Normal auditory acuity Mouth: No deformity or lesions Lungs: Clear throughout to auscultation Heart: Regular rate and rhythm; no murmurs, rubs or bruits Abdomen: Soft, non tender and non distended. No masses, hepatosplenomegaly or hernias noted. Normal Bowel sounds Rectal: deferred to colonoscopy Musculoskeletal: Symmetrical with no gross deformities  Pulses:  Normal pulses noted Extremities: No clubbing, cyanosis, edema or deformities noted Neurological: Alert oriented x 4, grossly nonfocal Psychological:  Alert and cooperative. Normal mood and affect  Assessment and Recommendations:  1. Lower  abdominal pain with abdominal bloating, increased gas and alternating diarrhea and constipation. Mild diverticulosis. Irritable bowel syndrome is very likely the cause of all of his current abdominal complaints. Begin Gas-X 4 times a day as needed. Begin Levsin 1-2 every 4 hours as needed for abdominal pain and bloating.  2. Family history of colon cancer in his mother in her 69s. He is due for a five-year surveillance colonoscopy. The risks (including bleeding, perforation, infection, missed lesions, medication reactions and possible hospitalization or surgery if complications occur), benefits, and alternatives to colonoscopy with possible biopsy and possible polypectomy were discussed with the patient and they consent to proceed.

## 2015-07-09 ENCOUNTER — Ambulatory Visit (AMBULATORY_SURGERY_CENTER): Payer: BLUE CROSS/BLUE SHIELD | Admitting: Gastroenterology

## 2015-07-09 ENCOUNTER — Encounter: Payer: Self-pay | Admitting: Gastroenterology

## 2015-07-09 ENCOUNTER — Encounter: Payer: BLUE CROSS/BLUE SHIELD | Admitting: Gastroenterology

## 2015-07-09 VITALS — BP 113/80 | HR 63 | Temp 96.4°F | Resp 11 | Ht 67.75 in | Wt 181.0 lb

## 2015-07-09 DIAGNOSIS — Z1211 Encounter for screening for malignant neoplasm of colon: Secondary | ICD-10-CM

## 2015-07-09 DIAGNOSIS — Z800 Family history of malignant neoplasm of digestive organs: Secondary | ICD-10-CM

## 2015-07-09 HISTORY — PX: COLONOSCOPY: SHX174

## 2015-07-09 MED ORDER — SODIUM CHLORIDE 0.9 % IV SOLN: 500.0000 mL | INTRAVENOUS | Status: DC

## 2015-07-09 NOTE — Patient Instructions (Signed)
YOU HAD AN ENDOSCOPIC PROCEDURE TODAY AT THE [facility]:   Refer to the procedure report that was given to you for any specific questions about what was found during the examination.  If the procedure report does not answer your questions, please call your gastroenterologist to clarify.  If you requested that your care partner not be given the details of your procedure findings, then the procedure report has been included in a sealed envelope for you to review at your convenience later.  YOU SHOULD EXPECT: Some feelings of bloating in the abdomen. Passage of more gas than usual.  Walking can help get rid of the air that was put into your GI tract during the procedure and reduce the bloating. If you had a lower endoscopy (such as a colonoscopy or flexible sigmoidoscopy) you may notice spotting of blood in your stool or on the toilet paper. If you underwent a bowel prep for your procedure, you may not have a normal bowel movement for a few days.  Please Note:  You might notice some irritation and congestion in your nose or some drainage.  This is from the oxygen used during your procedure.  There is no need for concern and it should clear up in a day or so.  SYMPTOMS TO REPORT IMMEDIATELY:   Following lower endoscopy (colonoscopy or flexible sigmoidoscopy):  Excessive amounts of blood in the stool  Significant tenderness or worsening of abdominal pains  Swelling of the abdomen that is new, acute  Fever of 100F or higher  For urgent or emergent issues, a gastroenterologist can be reached at any hour by calling [phone].   DIET: Your first meal following the procedure should be a small meal and then it is ok to progress to your normal diet. Heavy or fried foods are harder to digest and may make you feel nauseous or bloated.  Likewise, meals heavy in dairy and vegetables can increase bloating.  Drink plenty of fluids but you should avoid alcoholic beverages for 24  hours.  ACTIVITY:  You should plan to take it easy for the rest of today and you should NOT DRIVE or use heavy machinery until tomorrow (because of the sedation medicines used during the test).    FOLLOW UP: Our staff will call the number listed on your records the next business day following your procedure to check on you and address any questions or concerns that you may have regarding the information given to you following your procedure. If we do not reach you, we will leave a message.  However, if you are feeling well and you are not experiencing any problems, there is no need to return our call.  We will assume that you have returned to your regular daily activities without incident.  If any biopsies were taken you will be contacted by phone or by letter within the next 1-3 weeks.  Please call us at [phone] if you have not heard about the biopsies in 3 weeks.    SIGNATURES/CONFIDENTIALITY: You and/or your care partner have signed paperwork which will be entered into your electronic medical record.  These signatures attest to the fact that that the information above on your After Visit Summary has been reviewed and is understood.  Full responsibility of the confidentiality of this discharge information lies with you and/or your care-partner.  Thank you for letting us take care of your healthcare needs today. 

## 2015-07-09 NOTE — Op Note (Signed)
[facility] Patient Name: David Arias Procedure Date: 07/09/2015 7:35 AM MRN: [id number] Endoscopist: Ladene Artist , MD Age: 60 Date of Birth: May 04, 1955 Gender: Male Procedure:                Colonoscopy Indications:              Screening in patient at increased risk: Family                            history of 1st-degree relative with colorectal                            cancer before age 19 years Medicines:                Monitored Anesthesia Care Procedure:                Pre-Anesthesia Assessment:                           - Prior to the procedure, a History and Physical                            was performed, and patient medications and                            allergies were reviewed. The patient's tolerance of                            previous anesthesia was also reviewed. The risks                            and benefits of the procedure and the sedation                            options and risks were discussed with the patient.                            All questions were answered, and informed consent                            was obtained. Prior Anticoagulants: The patient has                            taken no previous anticoagulant or antiplatelet                            agents. ASA Grade Assessment: II - A patient with                            mild systemic disease. After reviewing the risks                            and benefits, the patient was deemed in  satisfactory condition to undergo the procedure.                           After obtaining informed consent, the colonoscope                            was passed under direct vision. Throughout the                            procedure, the patient's blood pressure, pulse, and                            oxygen saturations were monitored continuously. The                            Model PCF-H190L [id number]) scope was introduced   through the anus and advanced to the the cecum,                            identified by appendiceal orifice and ileocecal                            valve. The colonoscopy was performed without                            difficulty. The patient tolerated the procedure                            well. The quality of the bowel preparation was                            good. The ileocecal valve, appendiceal orifice, and                            rectum were photographed. Scope In: 8:12:44 AM Scope Out: 8:25:06 AM Scope Withdrawal Time: 0 hours 10 minutes 18 seconds  Total Procedure Duration: 0 hours 12 minutes 22 seconds  Findings:                 The digital rectal exam was normal.                           The colon (entire examined portion) appeared normal.                           The retroflexed view of the distal rectum and anal                            verge was normal and showed no anal or rectal                            abnormalities. Complications:            No immediate complications. Estimated Blood Loss:     Estimated blood loss: none. Impression:               -  The entire examined colon is normal. Recommendation:           - Patient has a contact number available for                            emergencies. The signs and symptoms of potential                            delayed complications were discussed with the                            patient. Return to normal activities tomorrow.                            Written discharge instructions were provided to the                            patient.                           - Resume previous diet.                           - Continue present medications.                           - Repeat colonoscopy in 5 years for screening                            purposes. Ladene Artist, MD 07/09/2015 8:28:27 AM This report has been signed electronically.

## 2015-07-09 NOTE — Progress Notes (Signed)
To recovery, report to Monday, RN, VSS 

## 2015-07-12 ENCOUNTER — Telehealth: Payer: Self-pay

## 2015-07-12 NOTE — Telephone Encounter (Signed)
  Follow up Call-  Call back number 07/09/2015  Post procedure Call Back phone  # [phone]  Permission to leave phone message Yes     Patient questions:  Do you have a fever, pain , or abdominal swelling? No. Pain Score  0 *  Have you tolerated food without any problems? Yes.    Have you been able to return to your normal activities? Yes.    Do you have any questions about your discharge instructions: Diet   No. Medications  No. Follow up visit  No.  Do you have questions or concerns about your Care? No.  Actions: * If pain score is 4 or above: No action needed, pain <4.

## 2015-08-13 DIAGNOSIS — J209 Acute bronchitis, unspecified: Secondary | ICD-10-CM | POA: Diagnosis not present

## 2016-04-21 ENCOUNTER — Encounter (HOSPITAL_COMMUNITY): Payer: Self-pay | Admitting: *Deleted

## 2016-04-21 DIAGNOSIS — S61012A Laceration without foreign body of left thumb without damage to nail, initial encounter: Secondary | ICD-10-CM | POA: Diagnosis not present

## 2016-04-21 DIAGNOSIS — Z23 Encounter for immunization: Secondary | ICD-10-CM | POA: Insufficient documentation

## 2016-04-21 DIAGNOSIS — W450XXA Nail entering through skin, initial encounter: Secondary | ICD-10-CM | POA: Diagnosis not present

## 2016-04-21 DIAGNOSIS — Y999 Unspecified external cause status: Secondary | ICD-10-CM | POA: Diagnosis not present

## 2016-04-21 DIAGNOSIS — Y939 Activity, unspecified: Secondary | ICD-10-CM | POA: Diagnosis not present

## 2016-04-21 DIAGNOSIS — Y929 Unspecified place or not applicable: Secondary | ICD-10-CM | POA: Insufficient documentation

## 2016-04-21 DIAGNOSIS — M10071 Idiopathic gout, right ankle and foot: Secondary | ICD-10-CM | POA: Insufficient documentation

## 2016-04-21 DIAGNOSIS — Z8673 Personal history of transient ischemic attack (TIA), and cerebral infarction without residual deficits: Secondary | ICD-10-CM | POA: Insufficient documentation

## 2016-04-21 DIAGNOSIS — Z79899 Other long term (current) drug therapy: Secondary | ICD-10-CM | POA: Diagnosis not present

## 2016-04-21 NOTE — ED Triage Notes (Signed)
He also wants to be seen for a lesion on his lt thumb

## 2016-04-21 NOTE — ED Triage Notes (Signed)
The pt is c/o lt foot pain  He has a history of golout and he thinks that is what he has now  He wants a shot for the pain

## 2016-04-22 ENCOUNTER — Emergency Department (HOSPITAL_COMMUNITY)
Admission: EM | Admit: 2016-04-22 | Discharge: 2016-04-22 | Disposition: A | Discharge status: Home / Self Care | Payer: BLUE CROSS/BLUE SHIELD | Attending: Emergency Medicine | Admitting: Emergency Medicine

## 2016-04-22 DIAGNOSIS — S61012A Laceration without foreign body of left thumb without damage to nail, initial encounter: Secondary | ICD-10-CM

## 2016-04-22 DIAGNOSIS — M10071 Idiopathic gout, right ankle and foot: Secondary | ICD-10-CM

## 2016-04-22 MED ORDER — PREDNISONE 10 MG PO TABS: 20.0000 mg | ORAL_TABLET | Freq: Two times a day (BID) | ORAL | 0 refills | Status: DC

## 2016-04-22 MED ORDER — METHYLPREDNISOLONE SODIUM SUCC 125 MG IJ SOLR
80.0000 mg | Freq: Once | INTRAMUSCULAR | Status: AC
  Administered 2016-04-22: 80 mg via INTRAMUSCULAR
  Filled 2016-04-22: qty 2

## 2016-04-22 MED ORDER — TETANUS-DIPHTH-ACELL PERTUSSIS 5-2.5-18.5 LF-MCG/0.5 IM SUSP
0.5000 mL | Freq: Once | INTRAMUSCULAR | Status: AC
  Administered 2016-04-22: 0.5 mL via INTRAMUSCULAR
  Filled 2016-04-22: qty 0.5

## 2016-04-22 NOTE — ED Notes (Signed)
See EDP assessment 

## 2016-04-22 NOTE — ED Provider Notes (Signed)
[facility] DEPT Provider Note   CSN: [id number] Arrival date & time: 04/21/16  2039     History   Chief Complaint Chief Complaint  Patient presents with  . Foot Pain    HPI David Arias is a 61 y.o. male who presents to the ED with foot pain. Patient reports hx of gout and states that this pain is similar. The pain is in the right foot and starts at the big toe and goes under the toe. Patient reports that he does not have gout often but when he does his doctor gives him a shot of steroid and then steroid by mouth. Patient has Allopurinol at home to use.   Patient also has an area on his left thumb that is almost like a blister that he noticed yesterday. He states that he had a superficial cut to the tip of the thumb but then noted the area beside his nail.   HPI  Past Medical History:  Diagnosis Date  . ALLERGIC RHINITIS 06/07/2007  . ANEMIA-IRON DEFICIENCY 11/05/2007  . Cervical spondylosis without myelopathy 06/07/2007  . Hyperlipidemia 12/13/2013  . Irritable bowel syndrome 02/04/2010  . MYOFASCIAL PAIN SYNDROME 06/07/2007    Patient Active Problem List   Diagnosis Date Noted  . Rash 03/30/2015  . Family history of colon cancer 03/02/2015  . Abdominal pain 02/19/2015  . TIA (transient ischemic attack) 06/17/2014  . Achilles tendinitis of left lower extremity 02/03/2014  . Gouty arthritis 01/28/2014  . Preventative health care 12/13/2013  . Hyperlipidemia 12/13/2013  . Gout 05/28/2012  . Back strain 08/16/2010  . CONSTIPATION 03/21/2010  . Irritable bowel syndrome 02/04/2010  . URTICARIA 02/04/2010  . ANEMIA-IRON DEFICIENCY 11/05/2007  . ALLERGIC RHINITIS 06/07/2007  . Cervical spondylosis without myelopathy 06/07/2007  . MYOFASCIAL PAIN SYNDROME 06/07/2007    Past Surgical History:  Procedure Laterality Date  . COLONOSCOPY         Home Medications    Prior to Admission medications   Medication Sig Start Date End Date Taking? Authorizing Provider    atorvastatin (LIPITOR) 20 MG tablet Take 1 tablet (20 mg total) by mouth daily. Patient not taking: Reported on 03/30/2015 03/02/15 03/01/16  Biagio Borg, MD  hyoscyamine (LEVSIN/SL) 0.125 MG SL tablet Take 1-2 tablets by mouth every 4 hours as needed Patient not taking: Reported on 07/09/2015 05/11/15   Ladene Artist, MD  predniSONE (DELTASONE) 10 MG tablet Take 2 tablets (20 mg total) by mouth 2 (two) times daily with a meal. 04/22/16   Hope Bunnie Pion, NP    Family History Family History  Problem Relation Age of Onset  . Cancer Mother 20    Colon Cancer  . Colon cancer Mother   . Heart disease Father   . Heart failure Father   . Hyperlipidemia Brother   . Hypertension Other   . Esophageal cancer Neg Hx   . Rectal cancer Neg Hx   . Stomach cancer Neg Hx     Social History Social History  Substance Use Topics  . Smoking status: Never Smoker  . Smokeless tobacco: Never Used  . Alcohol use 0.0 oz/week     Comment: rarely     Allergies   Crestor [rosuvastatin]   Review of Systems Review of Systems  Musculoskeletal: Positive for arthralgias.  Skin: Positive for wound.     Physical Exam Updated Vital Signs BP 142/88   Pulse 78   Temp 98.1 F (36.7 C)   Resp 16   Ht  5\' 8"  (1.727 m)   Wt 85.3 kg   SpO2 99%   BMI 28.59 kg/m   Physical Exam  Constitutional: He is oriented to person, place, and time. He appears well-developed and well-nourished. No distress.  HENT:  Head: Normocephalic.  Eyes: EOM are normal.  Neck: Neck supple.  Cardiovascular: Normal rate.   Pulmonary/Chest: Effort normal.  Musculoskeletal: Normal range of motion.  Tenderness and warmth to the right great toe.  There is an area to the left thumb that appears as a superficial laceration. No red streaking or signs of infection.   Neurological: He is alert and oriented to person, place, and time. No cranial nerve deficit.  Skin: Skin is warm and dry.  Psychiatric: He has a normal mood and  affect. His behavior is normal.  Nursing note and vitals reviewed.    ED Treatments / Results  Labs (all labs ordered are listed, but only abnormal results are displayed) Labs Reviewed - No data to display  Radiology No results found.  Procedures Procedures (including critical care time)  Medications Ordered in ED Medications  methylPREDNISolone sodium succinate (SOLU-MEDROL) 125 mg/2 mL injection 80 mg (not administered)  Tdap (BOOSTRIX) injection 0.5 mL (not administered)     Initial Impression / Assessment and Plan / ED Course  I have reviewed the triage vital signs and the nursing notes.   Final Clinical Impressions(s) / ED Diagnoses   Final diagnoses:  Acute idiopathic gout of right foot  Laceration of left thumb without foreign body without damage to nail, initial encounter    New Prescriptions New Prescriptions   PREDNISONE (DELTASONE) 10 MG TABLET    Take 2 tablets (20 mg total) by mouth 2 (two) times daily with a meal.     Ashley Murrain, NP 04/22/16 0106    Everlene Balls, MD 04/22/16 1251

## 2016-04-22 NOTE — Discharge Instructions (Signed)
Follow up with your doctor and take your home medication as directed.

## 2016-04-22 NOTE — ED Notes (Signed)
E-signature not working. Pt understands d/c instructions.  

## 2016-05-11 DIAGNOSIS — J101 Influenza due to other identified influenza virus with other respiratory manifestations: Secondary | ICD-10-CM | POA: Diagnosis not present

## 2016-07-24 ENCOUNTER — Ambulatory Visit (INDEPENDENT_AMBULATORY_CARE_PROVIDER_SITE_OTHER): Payer: BLUE CROSS/BLUE SHIELD | Admitting: Family Medicine

## 2016-07-24 ENCOUNTER — Encounter: Payer: Self-pay | Admitting: Family Medicine

## 2016-07-24 VITALS — BP 138/76 | HR 96 | Temp 98.1°F | Ht 68.0 in | Wt 185.9 lb

## 2016-07-24 DIAGNOSIS — M79674 Pain in right toe(s): Secondary | ICD-10-CM | POA: Diagnosis not present

## 2016-07-24 MED ORDER — PREDNISONE 10 MG PO TABS: 10.0000 mg | ORAL_TABLET | Freq: Every day | ORAL | 0 refills | Status: DC

## 2016-07-24 NOTE — Progress Notes (Signed)
Pre visit review using our clinic review tool, if applicable. No additional management support is needed unless otherwise documented below in the visit note. 

## 2016-07-24 NOTE — Patient Instructions (Signed)
Take the prednisone as prescribed.  I hope you are feeling better soon! Seek care immediately if worsening, new concerns or you are not improving with treatment.   Gout Gout is painful swelling that can happen in some of your joints. Gout is a type of arthritis. This condition is caused by having too much uric acid in your body. Uric acid is a chemical that is made when your body breaks down substances called purines. If your body has too much uric acid, sharp crystals can form and build up in your joints. This causes pain and swelling. Gout attacks can happen quickly and be very painful (acute gout). Over time, the attacks can affect more joints and happen more often (chronic gout). Follow these instructions at home: During a Gout Attack   If directed, put ice on the painful area:  Put ice in a plastic bag.  Place a towel between your skin and the bag.  Leave the ice on for 20 minutes, 2-3 times a day.  Rest the joint as much as possible. If the joint is in your leg, you may be given crutches to use.  Raise (elevate) the painful joint above the level of your heart as often as you can.  Drink enough fluids to keep your pee (urine) clear or pale yellow.  Take over-the-counter and prescription medicines only as told by your doctor.  Do not drive or use heavy machinery while taking prescription pain medicine.  Follow instructions from your doctor about what you can or cannot eat and drink.  Return to your normal activities as told by your doctor. Ask your doctor what activities are safe for you. Avoiding Future Gout Attacks   Follow a low-purine diet as told by a specialist (dietitian) or your doctor. Avoid foods and drinks that have a lot of purines, such as:  Liver.  Kidney.  Anchovies.  Asparagus.  Herring.  Mushrooms  Mussels.  Beer.  Limit alcohol intake to no more than 1 drink a day for nonpregnant women and 2 drinks a day for men. One drink equals 12 oz of  beer, 5 oz of wine, or 1 oz of hard liquor.  Stay at a healthy weight or lose weight if you are overweight. If you want to lose weight, talk with your doctor. It is important that you do not lose weight too fast.  Start or continue an exercise plan as told by your doctor.  Drink enough fluids to keep your pee clear or pale yellow.  Take over-the-counter and prescription medicines only as told by your doctor.  Keep all follow-up visits as told by your doctor. This is important. Contact a doctor if:  You have another gout attack.  You still have symptoms of a gout attack after10 days of treatment.  You have problems (side effects) because of your medicines.  You have chills or a fever.  You have burning pain when you pee (urinate).  You have pain in your lower back or belly. Get help right away if:  You have very bad pain.  Your pain cannot be controlled.  You cannot pee. This information is not intended to replace advice given to you by your health care provider. Make sure you discuss any questions you have with your health care provider. Document Released: 12/28/2007 Document Revised: 08/26/2015 Document Reviewed: 12/31/2014 Elsevier Interactive Patient Education  2017 [employer].

## 2016-07-24 NOTE — Progress Notes (Signed)
HPI:  Acute visit for R foot pain" -pain, swelling R foot x 2 days -aleve not helping -feels like gout to him -hx gout, last flare R foot 04/2016 -2 flares in last few years -on allopurinol and other prophylactic med in the past but did not like taking and off of these for several years -reports usually require prednisone for flare   ROS: See pertinent positives and negatives per HPI.  Past Medical History:  Diagnosis Date  . ALLERGIC RHINITIS 06/07/2007  . ANEMIA-IRON DEFICIENCY 11/05/2007  . Cervical spondylosis without myelopathy 06/07/2007  . Hyperlipidemia 12/13/2013  . Irritable bowel syndrome 02/04/2010  . MYOFASCIAL PAIN SYNDROME 06/07/2007    Past Surgical History:  Procedure Laterality Date  . COLONOSCOPY      Family History  Problem Relation Age of Onset  . Cancer Mother 61    Colon Cancer  . Colon cancer Mother   . Heart disease Father   . Heart failure Father   . Hyperlipidemia Brother   . Hypertension Other   . Esophageal cancer Neg Hx   . Rectal cancer Neg Hx   . Stomach cancer Neg Hx     Social History   Social History  . Marital status: Divorced    Spouse name: N/A  . Number of children: N/A  . Years of education: N/A   Occupational History  . TECHNICIAN Leisure centre manager   Social History Main Topics  . Smoking status: Never Smoker  . Smokeless tobacco: Never Used  . Alcohol use 0.0 oz/week     Comment: rarely  . Drug use: No  . Sexual activity: Yes    Partners: Male   Other Topics Concern  . None   Social History Narrative  . None     Current Outpatient Prescriptions:  .  predniSONE (DELTASONE) 10 MG tablet, Take 1 tablet (10 mg total) by mouth daily with breakfast. 40mg  daily (4 tabs) for 3-5 days, then taper: 30mg  x 2 days, then 20mg  x2 days, then 10mg  x 2 days, Disp: 32 tablet, Rfl: 0  EXAM:  Vitals:   07/24/16 1432  BP: 138/76  Pulse: 96  Temp: 98.1 F (36.7 C)    Body mass index is 28.27 kg/m.  GENERAL:  vitals reviewed and listed above, alert, oriented, appears well hydrated and in no acute distress  HEENT: atraumatic, conjunttiva clear, no obvious abnormalities on inspection of external nose and ears  NECK: no obvious masses on inspection  MS: moves all extremities without noticeable abnormality, R great toe swollen, red, tender to touch; no skin breaks or wounds, cap refill intact, NV intact distal  PSYCH: pleasant and cooperative, no obvious depression or anxiety  ASSESSMENT AND PLAN:  Discussed the following assessment and plan:  Pain of toe of right foot  -we discussed possible serious and likely etiologies, workup and treatment, treatment risks and return precautions; presumed gout -after this discussion, David Arias opted for oral prednisone after discussion risks -follow up advised prn -of course, we advised [place]  to return or notify a doctor immediately if symptoms worsen or persist or new concerns arise.  Patient Instructions  Take the prednisone as prescribed.  I hope you are feeling better soon! Seek care immediately if worsening, new concerns or you are not improving with treatment.   Gout Gout is painful swelling that can happen in some of your joints. Gout is a type of arthritis. This condition is caused by having too much uric acid in  your body. Uric acid is a chemical that is made when your body breaks down substances called purines. If your body has too much uric acid, sharp crystals can form and build up in your joints. This causes pain and swelling. Gout attacks can happen quickly and be very painful (acute gout). Over time, the attacks can affect more joints and happen more often (chronic gout). Follow these instructions at home: During a Gout Attack   If directed, put ice on the painful area:  Put ice in a plastic bag.  Place a towel between your skin and the bag.  Leave the ice on for 20 minutes, 2-3 times a day.  Rest the joint as much as possible. If  the joint is in your leg, you may be given crutches to use.  Raise (elevate) the painful joint above the level of your heart as often as you can.  Drink enough fluids to keep your pee (urine) clear or pale yellow.  Take over-the-counter and prescription medicines only as told by your doctor.  Do not drive or use heavy machinery while taking prescription pain medicine.  Follow instructions from your doctor about what you can or cannot eat and drink.  Return to your normal activities as told by your doctor. Ask your doctor what activities are safe for you. Avoiding Future Gout Attacks   Follow a low-purine diet as told by a specialist (dietitian) or your doctor. Avoid foods and drinks that have a lot of purines, such as:  Liver.  Kidney.  Anchovies.  Asparagus.  Herring.  Mushrooms  Mussels.  Beer.  Limit alcohol intake to no more than 1 drink a day for nonpregnant women and 2 drinks a day for men. One drink equals 12 oz of beer, 5 oz of wine, or 1 oz of hard liquor.  Stay at a healthy weight or lose weight if you are overweight. If you want to lose weight, talk with your doctor. It is important that you do not lose weight too fast.  Start or continue an exercise plan as told by your doctor.  Drink enough fluids to keep your pee clear or pale yellow.  Take over-the-counter and prescription medicines only as told by your doctor.  Keep all follow-up visits as told by your doctor. This is important. Contact a doctor if:  You have another gout attack.  You still have symptoms of a gout attack after10 days of treatment.  You have problems (side effects) because of your medicines.  You have chills or a fever.  You have burning pain when you pee (urinate).  You have pain in your lower back or belly. Get help right away if:  You have very bad pain.  Your pain cannot be controlled.  You cannot pee. This information is not intended to replace advice given to you  by your health care provider. Make sure you discuss any questions you have with your health care provider. Document Released: 12/28/2007 Document Revised: 08/26/2015 Document Reviewed: 12/31/2014 Elsevier Interactive Patient Education  2017 [place]., DO

## 2016-09-23 DIAGNOSIS — J209 Acute bronchitis, unspecified: Secondary | ICD-10-CM | POA: Diagnosis not present

## 2016-11-08 ENCOUNTER — Telehealth: Payer: Self-pay | Admitting: Internal Medicine

## 2016-11-08 ENCOUNTER — Encounter: Payer: Self-pay | Admitting: Family Medicine

## 2016-11-08 NOTE — Telephone Encounter (Signed)
Patient is requesting to transfer care from [facility] to Dr. Anitra Lauth at [facility]. Please call to schedule an appointment.

## 2016-11-08 NOTE — Telephone Encounter (Signed)
Please advise. Thanks.  

## 2016-11-08 NOTE — Telephone Encounter (Signed)
OK with me. Has a courtesy note been sent to Dr. Jenny Reichmann?

## 2016-11-08 NOTE — Telephone Encounter (Signed)
Ok with me 

## 2016-11-10 NOTE — Telephone Encounter (Signed)
LM for patient to [facility] to schedule appt-transfer approved

## 2016-11-16 ENCOUNTER — Encounter: Payer: Self-pay | Admitting: Family Medicine

## 2016-11-16 ENCOUNTER — Ambulatory Visit (INDEPENDENT_AMBULATORY_CARE_PROVIDER_SITE_OTHER): Payer: BLUE CROSS/BLUE SHIELD | Admitting: Family Medicine

## 2016-11-16 ENCOUNTER — Telehealth: Payer: Self-pay | Admitting: Family Medicine

## 2016-11-16 VITALS — BP 144/81 | HR 71 | Temp 97.9°F | Resp 16 | Ht 68.0 in | Wt 183.8 lb

## 2016-11-16 DIAGNOSIS — M791 Myalgia, unspecified site: Secondary | ICD-10-CM

## 2016-11-16 DIAGNOSIS — Z8739 Personal history of other diseases of the musculoskeletal system and connective tissue: Secondary | ICD-10-CM

## 2016-11-16 DIAGNOSIS — Z0000 Encounter for general adult medical examination without abnormal findings: Secondary | ICD-10-CM

## 2016-11-16 LAB — CBC WITH DIFFERENTIAL/PLATELET
BASOS PCT: 1 % (ref 0.0–3.0)
Basophils Absolute: 0.1 10*3/uL (ref 0.0–0.1)
EOS ABS: 0.3 10*3/uL (ref 0.0–0.7)
EOS PCT: 3.7 % (ref 0.0–5.0)
HCT: 41.9 % (ref 39.0–52.0)
Hemoglobin: 14.1 g/dL (ref 13.0–17.0)
LYMPHS ABS: 1.8 10*3/uL (ref 0.7–4.0)
Lymphocytes Relative: 27.2 % (ref 12.0–46.0)
MCHC: 33.6 g/dL (ref 30.0–36.0)
MCV: 84.8 fl (ref 78.0–100.0)
MONO ABS: 0.6 10*3/uL (ref 0.1–1.0)
Monocytes Relative: 8.3 % (ref 3.0–12.0)
NEUTROS PCT: 59.8 % (ref 43.0–77.0)
Neutro Abs: 4.1 10*3/uL (ref 1.4–7.7)
PLATELETS: 328 10*3/uL (ref 150.0–400.0)
RBC: 4.94 Mil/uL (ref 4.22–5.81)
RDW: 13.3 % (ref 11.5–15.5)
WBC: 6.8 10*3/uL (ref 4.0–10.5)

## 2016-11-16 LAB — SEDIMENTATION RATE: Sed Rate: 11 mm/hr (ref 0–20)

## 2016-11-16 LAB — C-REACTIVE PROTEIN: CRP: 0.2 mg/dL — ABNORMAL LOW (ref 0.5–20.0)

## 2016-11-16 LAB — CK: CK TOTAL: 107 U/L (ref 7–232)

## 2016-11-16 LAB — TSH: TSH: 0.84 u[IU]/mL (ref 0.35–4.50)

## 2016-11-16 LAB — T4, FREE: FREE T4: 0.94 ng/dL (ref 0.60–1.60)

## 2016-11-16 NOTE — Progress Notes (Signed)
Office Note 11/16/2016  CC:  Chief Complaint  Patient presents with  . Leg Pain  . Abdominal Pain    lower    HPI:  David Arias is a 61 y.o. male who is here to transfer care from another [facility] location.  Wants to discuss leg pains.  Old records in EPIC/[facility] EMR were reviewed prior to or during today's visit.  Onset of pain in muscles of thighs and calves about 8 wks ago.  Occ also pain in upper arm muscles bilat.  The most consistent areas of pain are inner thighs and L gluteal region.  No paresthesias.  The pains occur about 4 days per week and last most of the day.  Ibup/aleve does help. No rash.  No joint pain or swelling.  No trauma or overuse prior to onset.  No illness prior to onset of sx's. No new meds prior to onset.  No fevers.  No weakness in limbs.  It hurts to touch the areas that he feels pain in. No melena or hematochezia.    ROS: occ lower abd swelling.  Hx of gout about 4 times per year.  No abnl weight.  Some chronic mild heat intolerance.  No oral ulcers. No changes in skin/dryness.  Eyes are dryer.  No dry mouth.   No dysuria, urgency, frequency, or penile d/c.   No hx of STD.   No HAs.    Past Medical History:  Diagnosis Date  . ALLERGIC RHINITIS 06/07/2007  . Altered consciousness 2016   MRI/MRA normal 2016.  Normal carotid dopplers 06/24/14.  Seen by neuro and felt NOT to be TIAs.  ? Freon exposure at his work.?  . ANEMIA-IRON DEFICIENCY 11/05/2007  . Cervical spondylosis without myelopathy 06/07/2007  . Diverticulosis   . Family history of colon cancer    in 1st deg, prior to age 28 yrs.  . Hyperlipidemia 12/13/2013  . Irritable bowel syndrome 02/04/2010  . MYOFASCIAL PAIN SYNDROME 06/07/2007    Past Surgical History:  Procedure Laterality Date  . Carotid dopplers  06/24/2014   Heterogeneous plaque, bilaterally (1-39%).  Normal subclavian and vertebrals.  Rpt 2 yrs.  . COLONOSCOPY  07/09/2015   Normal (Dr. Fuller Plan): repeat 5 yrs due to strong FH CC.  Marland Kitchen  TRANSTHORACIC ECHOCARDIOGRAM  06/24/2014   EF 55-60%, LVH with normal LV size.  Normal wall motion. No valvular problems.    Family History  Problem Relation Age of Onset  . Cancer Mother 35       Colon Cancer  . Colon cancer Mother   . Heart disease Father   . Heart failure Father   . Hyperlipidemia Brother   . Hypertension Other   . Esophageal cancer Neg Hx   . Rectal cancer Neg Hx   . Stomach cancer Neg Hx     Social History   Social History  . Marital status: Divorced    Spouse name: N/A  . Number of children: N/A  . Years of education: N/A   Occupational History  . TECHNICIAN Leisure centre manager   Social History Main Topics  . Smoking status: Never Smoker  . Smokeless tobacco: Never Used  . Alcohol use 0.0 oz/week     Comment: rarely  . Drug use: No  . Sexual activity: Yes    Partners: Male   Other Topics Concern  . Not on file   Social History Narrative   Single, 3 sons.   Educ: some college.  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   Tobacco: none   Alc: rare wine.   Drugs: none    Outpatient Encounter Prescriptions as of 11/16/2016  Medication Sig  . [DISCONTINUED] predniSONE (DELTASONE) 10 MG tablet Take 1 tablet (10 mg total) by mouth daily with breakfast. 34m daily (4 tabs) for 3-5 days, then taper: 347mx 2 days, then 2035m2 days, then 40m72m2 days (Patient not taking: Reported on 11/16/2016)   No facility-administered encounter medications on file as of 11/16/2016.     Allergies  Allergen Reactions  . Crestor [Rosuvastatin]     Joint pain    ROS-see HPI; also Review of Systems  Constitutional: Negative for fatigue and fever.  HENT: Negative for congestion and sore throat.   Eyes: Negative for visual disturbance.  Respiratory: Negative for cough.   Cardiovascular: Negative for chest pain.  Gastrointestinal: Negative for abdominal pain and nausea.  Genitourinary: Negative for dysuria.  Musculoskeletal: Negative for back  pain and joint swelling.  Skin: Negative for rash.  Neurological: Negative for weakness and headaches.  Hematological: Negative for adenopathy.    PE; Blood pressure (!) 144/81, pulse 71, temperature 97.9 F (36.6 C), temperature source Oral, resp. rate 16, height _0  (1.727 m), weight 183 lb 12 oz (83.3 kg), SpO2 97 %. Gen: Alert, well appearing.  Patient is oriented to person, place, time, and situation. AFFECT: pleasant, lucid thought and speech. Neck - No masses or thyromegaly or limitation in range of motion CV: RRR, no m/r/g.   LUNGS: CTA bilat, nonlabored resps, good aeration in all lung fields. ABD: soft, ND, no bruit, HSM, or mass.  Mild lower abd discomfort diffusely w/out guarding or rebound. EXT: no clubbing, cyanosis, or edema.  No tenderness to palpation of any muscle groups or joints today.  No rashes. He had focal TTP on soft tissue of lateral aspect of L hamstring (distal to the level of the greater troch).  Pertinent labs:  None today  ASSESSMENT AND PLAN:   Myalgias x 8 weeks, primarily in limbs--esp medial thighs. In review of his PMH section of chart I do see the dx of "Myofascial pain syndrome" from 2009. Today his exam is normal except for the focal tenderness in lateral aspect of L hamstring mm. Will check a lab panel: CBC, CMET, TSH, free T4, T3, CPK, ESR, CRP, ANA. He may continue aleve/ibuprofen in the small doses he is currently taking.  Spent 30 min with pt today, with >50% of this time spent in counseling and care coordination regarding the above problems.  An After Visit Summary was printed and given to the patient.  Return for 3-4 wks fasting CPE.  Signed:  PhilCrissie Sickles           11/16/2016

## 2016-11-16 NOTE — Telephone Encounter (Signed)
Patient is requesting lab orders place before CPE so he can go to [place] because it is closer.  Also, can he get Uric Acid?  He states he has gout and he likes to get it checked regularly.  Please advise.

## 2016-11-17 ENCOUNTER — Ambulatory Visit: Payer: BLUE CROSS/BLUE SHIELD | Admitting: Family Medicine

## 2016-11-17 LAB — T3: T3, Total: 141.1 ng/dL (ref 76–181)

## 2016-11-17 NOTE — Telephone Encounter (Signed)
Saw pt in office 11/16/16

## 2016-11-17 NOTE — Telephone Encounter (Signed)
He requested these labs to be drawn after seeing you.   He wants orders to be placed for CPE labs and uric acid prior to his CPE 9.17.18.   Okay to place orders for CPE labs and uric acid?

## 2016-11-17 NOTE — Telephone Encounter (Signed)
Yes, thanks

## 2016-11-17 NOTE — Telephone Encounter (Signed)
Labs ordered.  Unable to get through to patient, will try again later.

## 2016-11-20 LAB — ANTINUCLEAR ANTIBODIES, IFA: ANA Titer 1: NEGATIVE

## 2016-11-20 NOTE — Telephone Encounter (Signed)
LM letting patient know his lab orders have been placed and he is able to get them drawn at [facility] lab at his convenience.

## 2016-12-01 IMAGING — CT CT ABD-PELV W/ CM
2 of 5 series · 17 of 46 positions shown, 19 images · IV contrast (OMNIPAQUE 350)
Comparison: None.

CLINICAL DATA: Persistent bilateral lower abdominal pain. History
of prostatitis.

EXAM:
CT ABDOMEN AND PELVIS WITH CONTRAST
TECHNIQUE: Multidetector CT imaging of the abdomen and pelvis was performed
using the standard protocol following bolus administration of
intravenous contrast.
CONTRAST:  100mL OMNIPAQUE IOHEXOL 300 MG/ML  SOLN

[Series 2: abd/ pelvis · axial · 0.78mm/px · z∈[-608,-238]mm · 14 of 84 slices shown, 16 images]
[im 5/84  soft-tissue]
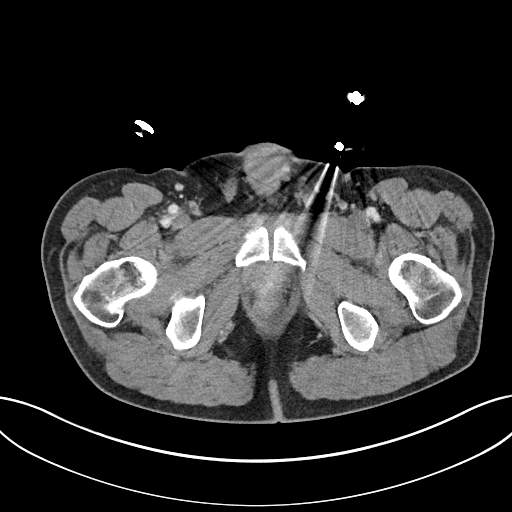
[im 5/84  bone]
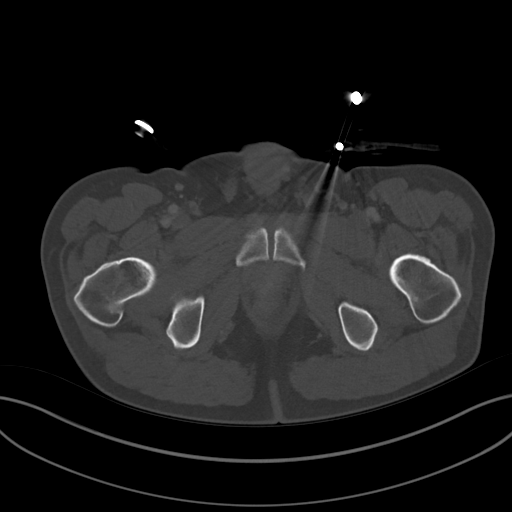
[im 9/84  soft-tissue]
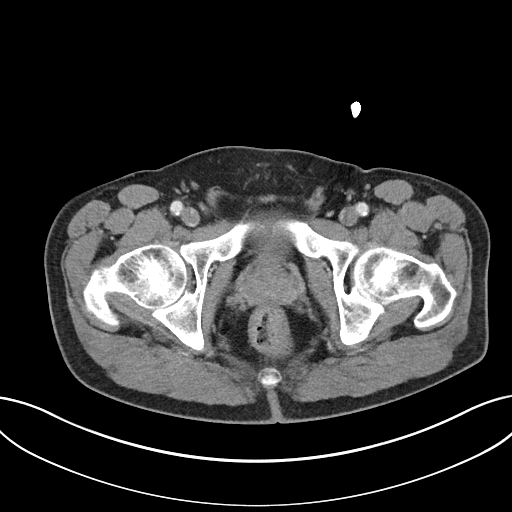
[im 18/84  soft-tissue]
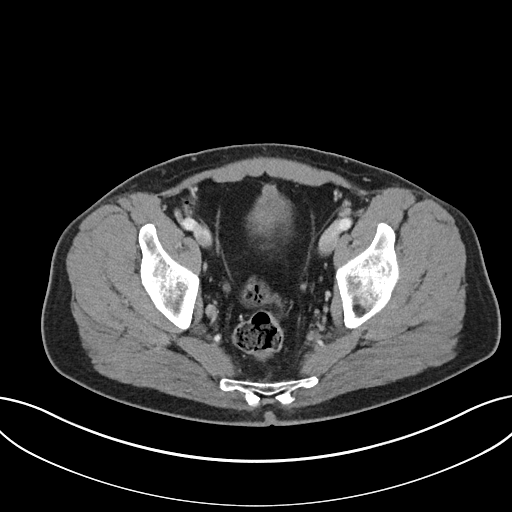
[im 22/84  soft-tissue]
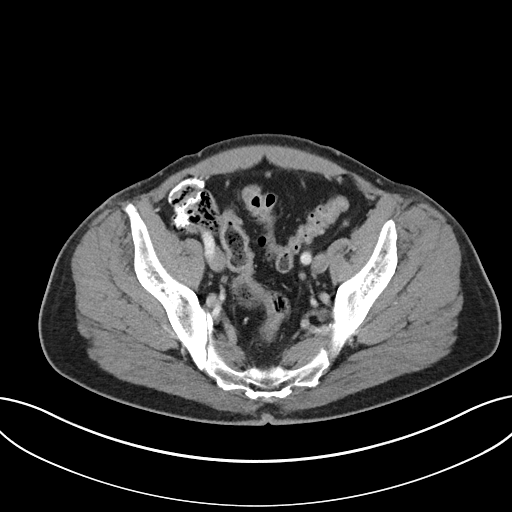
[im 27/84  soft-tissue]
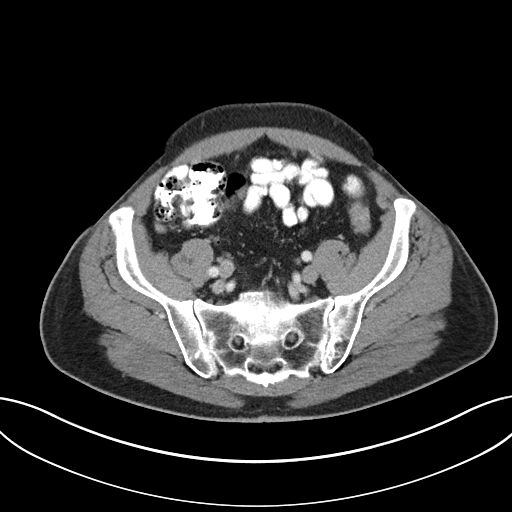
[im 35/84  soft-tissue]
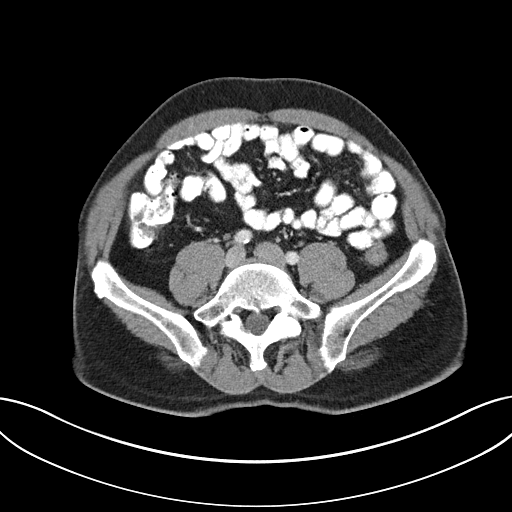
[im 40/84  soft-tissue]
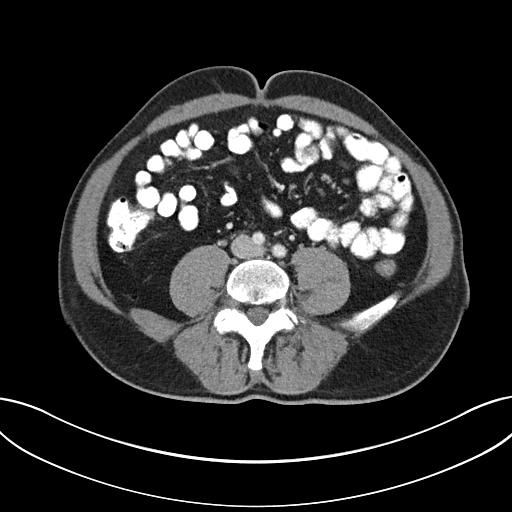
[im 44/84  soft-tissue]
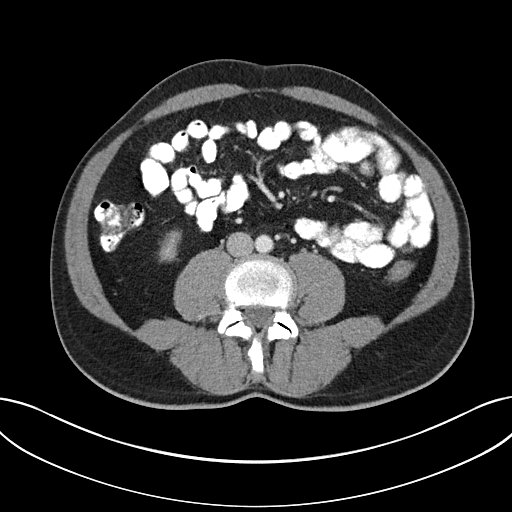
[im 49/84  soft-tissue]
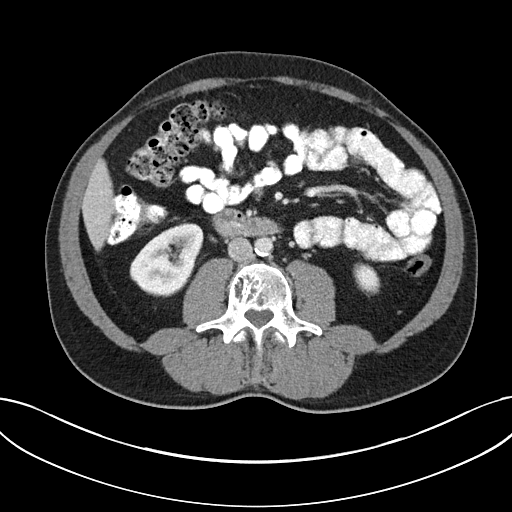
[im 49/84  bone]
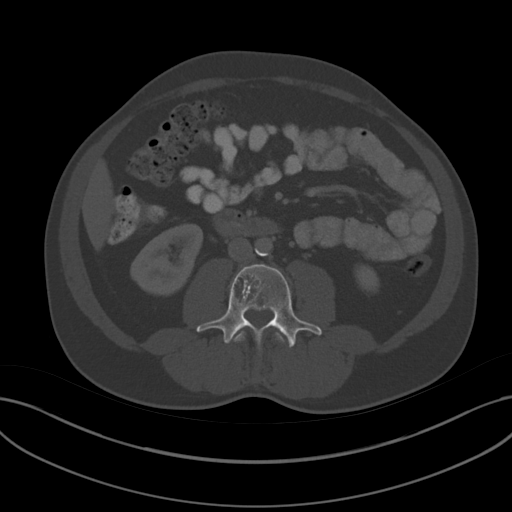
[im 57/84  soft-tissue]
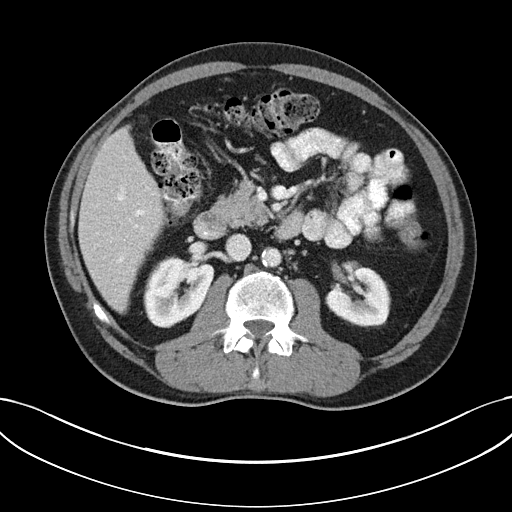
[im 62/84  soft-tissue]
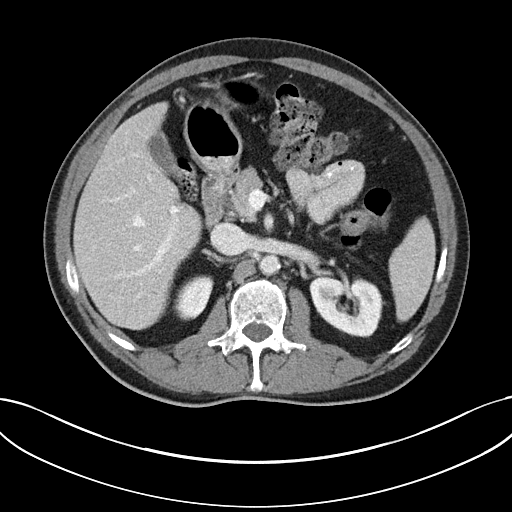
[im 66/84  soft-tissue]
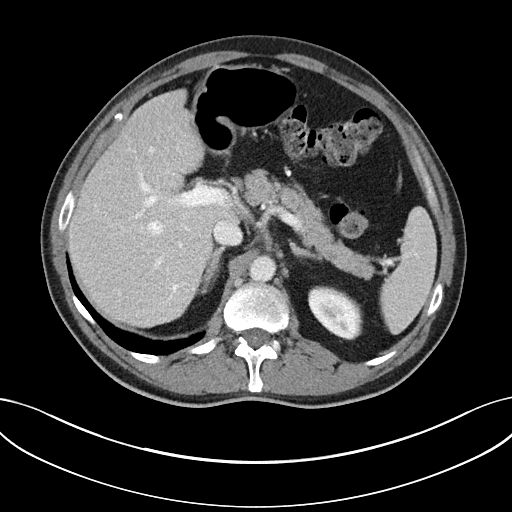
[im 75/84  soft-tissue]
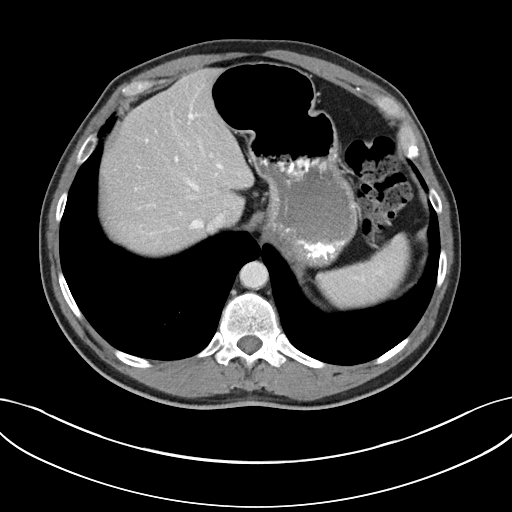
[im 79/84  soft-tissue]
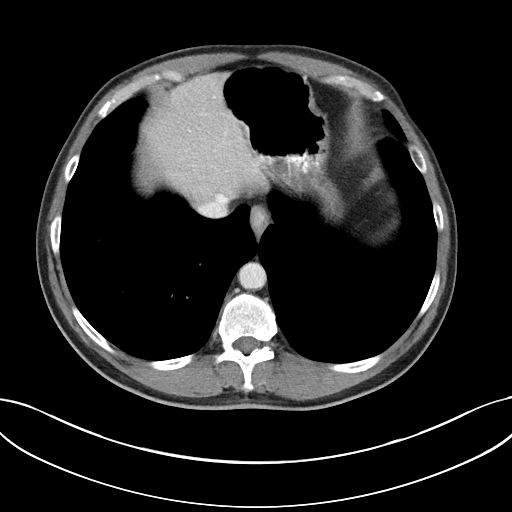

[Series 602: coronal abd/pel · coronal · 0.81mm/px · 3 of 133 slices shown]
[im 45/133  soft-tissue]
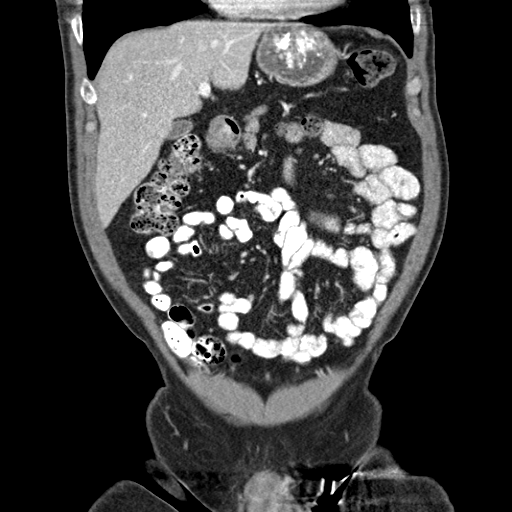
[im 59/133  soft-tissue]
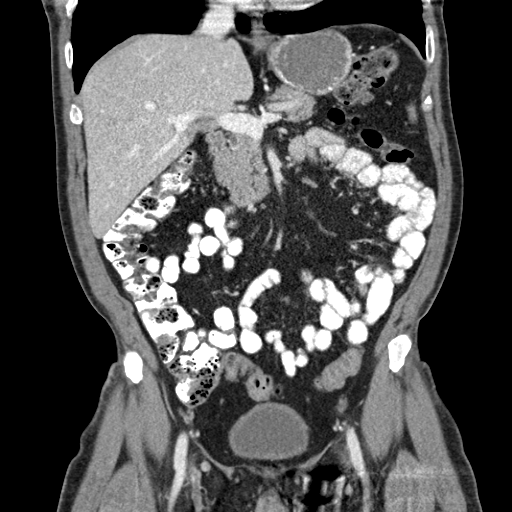
[im 74/133  soft-tissue]
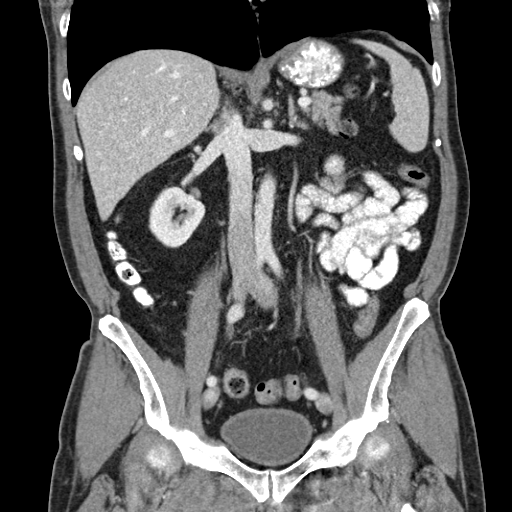

[17 of 46 positions shown; findings below may reference images not displayed]

FINDINGS: Lower chest:  No acute findings.

Hepatobiliary: No masses or other significant abnormality.

Pancreas: No mass, inflammatory changes, or other significant
abnormality.

Spleen: Within normal limits in size and appearance.

Adrenals/Urinary Tract: Small right renal cyst. No urinary tract
calculi or hydronephrosis. Normal appearing adrenal glands.

Stomach/Bowel: Multiple colonic diverticula without evidence of
diverticulitis. Normal appearing appendix. No gastric or small bowel
abnormalities.

Vascular/Lymphatic: Atheromatous arterial calcifications. No
enlarged lymph nodes.

Reproductive: No mass or other significant abnormality.

Other: None.

Musculoskeletal: L3 vertebral hemangioma. Additional hemangioma or
other nonaggressive mixed density lesion in the left iliac bone.
IMPRESSION: 1. No acute abnormality.
2. Mild colonic diverticulosis.

## 2016-12-13 ENCOUNTER — Other Ambulatory Visit (INDEPENDENT_AMBULATORY_CARE_PROVIDER_SITE_OTHER): Payer: BLUE CROSS/BLUE SHIELD

## 2016-12-13 DIAGNOSIS — Z8739 Personal history of other diseases of the musculoskeletal system and connective tissue: Secondary | ICD-10-CM | POA: Diagnosis not present

## 2016-12-13 DIAGNOSIS — Z0000 Encounter for general adult medical examination without abnormal findings: Secondary | ICD-10-CM

## 2016-12-13 LAB — COMPREHENSIVE METABOLIC PANEL
ALT: 29 U/L (ref 0–53)
AST: 20 U/L (ref 0–37)
Albumin: 4.2 g/dL (ref 3.5–5.2)
Alkaline Phosphatase: 101 U/L (ref 39–117)
BILIRUBIN TOTAL: 0.4 mg/dL (ref 0.2–1.2)
BUN: 11 mg/dL (ref 6–23)
CALCIUM: 9.7 mg/dL (ref 8.4–10.5)
CHLORIDE: 104 meq/L (ref 96–112)
CO2: 26 meq/L (ref 19–32)
CREATININE: 1.05 mg/dL (ref 0.40–1.50)
GFR: 76.04 mL/min (ref >60.00)
GLUCOSE: 103 mg/dL — AB (ref 70–99)
Potassium: 4.4 mEq/L (ref 3.5–5.1)
SODIUM: 140 meq/L (ref 135–145)
Total Protein: 7.5 g/dL (ref 6.0–8.3)

## 2016-12-13 LAB — LIPID PANEL
Cholesterol: 255 mg/dL — ABNORMAL HIGH (ref 0–200)
HDL: 59.2 mg/dL (ref >39.00)
LDL Cholesterol: 163 mg/dL — ABNORMAL HIGH (ref 0–99)
NONHDL: 195.44
Total CHOL/HDL Ratio: 4
Triglycerides: 160 mg/dL — ABNORMAL HIGH (ref 0.0–149.0)
VLDL: 32 mg/dL (ref 0.0–40.0)

## 2016-12-13 LAB — CBC WITH DIFFERENTIAL/PLATELET
BASOS PCT: 0.7 % (ref 0.0–3.0)
Basophils Absolute: 0.1 10*3/uL (ref 0.0–0.1)
Eosinophils Absolute: 0.3 10*3/uL (ref 0.0–0.7)
Eosinophils Relative: 3 % (ref 0.0–5.0)
HCT: 45.3 % (ref 39.0–52.0)
Hemoglobin: 15.2 g/dL (ref 13.0–17.0)
LYMPHS ABS: 2.2 10*3/uL (ref 0.7–4.0)
LYMPHS PCT: 23.2 % (ref 12.0–46.0)
MCHC: 33.7 g/dL (ref 30.0–36.0)
MCV: 84.7 fl (ref 78.0–100.0)
MONOS PCT: 9.8 % (ref 3.0–12.0)
Monocytes Absolute: 0.9 10*3/uL (ref 0.1–1.0)
NEUTROS ABS: 6.1 10*3/uL (ref 1.4–7.7)
Neutrophils Relative %: 63.3 % (ref 43.0–77.0)
PLATELETS: 402 10*3/uL — AB (ref 150.0–400.0)
RBC: 5.34 Mil/uL (ref 4.22–5.81)
RDW: 13.6 % (ref 11.5–15.5)
WBC: 9.6 10*3/uL (ref 4.0–10.5)

## 2016-12-13 LAB — TSH: TSH: 1.32 u[IU]/mL (ref 0.35–4.50)

## 2016-12-13 LAB — URIC ACID: Uric Acid, Serum: 7.5 mg/dL (ref 4.0–7.8)

## 2016-12-14 ENCOUNTER — Telehealth: Payer: Self-pay | Admitting: Family Medicine

## 2016-12-14 ENCOUNTER — Encounter: Payer: Self-pay | Admitting: Family Medicine

## 2016-12-14 NOTE — Telephone Encounter (Signed)
Patient has missed a call from our office. Please call patient back.

## 2016-12-14 NOTE — Telephone Encounter (Signed)
Patient advised-patient was asking about uric acid test

## 2016-12-14 NOTE — Telephone Encounter (Signed)
It was an auto call about his apt on 12/18/16 at 8:15am with Dr. Anitra Lauth for his CPE. Please call pt back. Thanks.

## 2016-12-14 NOTE — Telephone Encounter (Signed)
Please advise. Thanks.  

## 2016-12-18 ENCOUNTER — Ambulatory Visit (INDEPENDENT_AMBULATORY_CARE_PROVIDER_SITE_OTHER): Payer: BLUE CROSS/BLUE SHIELD | Admitting: Family Medicine

## 2016-12-18 ENCOUNTER — Telehealth: Payer: Self-pay | Admitting: Family Medicine

## 2016-12-18 ENCOUNTER — Encounter: Payer: Self-pay | Admitting: Family Medicine

## 2016-12-18 VITALS — BP 131/79 | HR 78 | Temp 98.2°F | Resp 16 | Ht 68.0 in | Wt 185.0 lb

## 2016-12-18 DIAGNOSIS — N5201 Erectile dysfunction due to arterial insufficiency: Secondary | ICD-10-CM

## 2016-12-18 DIAGNOSIS — E7800 Pure hypercholesterolemia, unspecified: Secondary | ICD-10-CM

## 2016-12-18 DIAGNOSIS — Z1211 Encounter for screening for malignant neoplasm of colon: Secondary | ICD-10-CM | POA: Diagnosis not present

## 2016-12-18 DIAGNOSIS — Z0000 Encounter for general adult medical examination without abnormal findings: Secondary | ICD-10-CM

## 2016-12-18 DIAGNOSIS — S8011XA Contusion of right lower leg, initial encounter: Secondary | ICD-10-CM | POA: Diagnosis not present

## 2016-12-18 DIAGNOSIS — Z125 Encounter for screening for malignant neoplasm of prostate: Secondary | ICD-10-CM | POA: Diagnosis not present

## 2016-12-18 LAB — PSA: PSA: 1.06 ng/mL (ref 0.10–4.00)

## 2016-12-18 MED ORDER — PRAVASTATIN SODIUM 20 MG PO TABS: 20.0000 mg | ORAL_TABLET | Freq: Every day | ORAL | 1 refills | Status: DC

## 2016-12-18 MED ORDER — SILDENAFIL CITRATE 20 MG PO TABS: ORAL_TABLET | ORAL | 1 refills | Status: DC

## 2016-12-18 NOTE — Patient Instructions (Signed)

## 2016-12-18 NOTE — Telephone Encounter (Signed)
Patient asked at check out if he has to see the physician in November just to have his cholesterol checked. Patient requesting to have blood work done at [facility].

## 2016-12-18 NOTE — [facility] Notes (Signed)
Office Note 12/18/2016  CC:  Chief Complaint  Patient presents with  . Annual Exam    Labs done    HPI:  David Arias is a 61 y.o. male who is here for annual health maintenance exam. Fasting HP labs and uric acid reviewed in detail today with pt. Cholesterol elevated.   Med mgmt in past: Crestor and lipitor caused joint pain so he stopped these and these pains stopped. He is open to trial of another statin.  Muscle pains that I saw him for 1 mo ago is overall improved, still up and down.  All lab w/u for this problem was normal.  His foot fell through his deck and he injured medial aspect of R LL and knee.  Still pain and swelling in medial knee. ROM of knee intact.  He c/o erections not being as firm as they used to be, don't last as long either.  He can finish intercourse to climax. He has never taken any med for this.   Past Medical History:  Diagnosis Date  . ALLERGIC RHINITIS 06/07/2007  . Altered consciousness 2016   MRI/MRA normal 2016.  Normal carotid dopplers 06/24/14.  Seen by neuro and felt NOT to be TIAs.  ? Freon exposure at his work.?  . ANEMIA-IRON DEFICIENCY 11/05/2007  . Cervical spondylosis without myelopathy 06/07/2007  . Diverticulosis   . Family history of colon cancer    in 1st deg, prior to age 59 yrs.  . Gout   . Hyperlipidemia 12/13/2013  . Irritable bowel syndrome 02/04/2010  . MYOFASCIAL PAIN SYNDROME 06/07/2007    Past Surgical History:  Procedure Laterality Date  . Carotid dopplers  06/24/2014   Heterogeneous plaque, bilaterally (1-39%).  Normal subclavian and vertebrals.  Rpt 2 yrs.  . COLONOSCOPY  07/09/2015   Normal (Dr. Fuller Plan): repeat 5 yrs due to strong FH CC.  Marland Kitchen TRANSTHORACIC ECHOCARDIOGRAM  06/24/2014   EF 55-60%, LVH with normal LV size.  Normal wall motion. No valvular problems.    Family History  Problem Relation Age of Onset  . Cancer Mother 40       Colon Cancer  . Colon cancer Mother   . Heart disease Father   . Heart  failure Father   . Hyperlipidemia Brother   . Hypertension Other   . Esophageal cancer Neg Hx   . Rectal cancer Neg Hx   . Stomach cancer Neg Hx     Social History   Social History  . Marital status: Divorced    Spouse name: N/A  . Number of children: N/A  . Years of education: N/A   Occupational History  . TECHNICIAN Leisure centre manager   Social History Main Topics  . Smoking status: Never Smoker  . Smokeless tobacco: Never Used  . Alcohol use 0.0 oz/week     Comment: rarely  . Drug use: No  . Sexual activity: Yes    Partners: Male   Other Topics Concern  . Not on file   Social History Narrative   Single, 3 sons.   Educ: some college.   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   Tobacco: none   Alc: rare wine.   Drugs: none    No outpatient prescriptions prior to visit.   No facility-administered medications prior to visit.     Allergies  Allergen Reactions  . Crestor [Rosuvastatin]     Joint pain    ROS Review of Systems  Constitutional: Negative for appetite change,  chills, fatigue and fever.  HENT: Negative for congestion, dental problem, ear pain and sore throat.   Eyes: Negative for discharge, redness and visual disturbance.  Respiratory: Negative for cough, chest tightness, shortness of breath and wheezing.   Cardiovascular: Negative for chest pain, palpitations and leg swelling.  Gastrointestinal: Negative for abdominal pain, blood in stool, diarrhea, nausea and vomiting.  Genitourinary: Negative for difficulty urinating, dysuria, flank pain, frequency, hematuria and urgency.  Musculoskeletal: Negative for arthralgias, back pain, joint swelling, myalgias and neck stiffness.  Skin: Negative for pallor and rash.  Neurological: Negative for dizziness, speech difficulty, weakness and headaches.  Hematological: Negative for adenopathy. Does not bruise/bleed easily.  Psychiatric/Behavioral: Negative for confusion and sleep disturbance. The  patient is not nervous/anxious.   ED: 6 mo of erection not as firm and not lasting as long but he is able to come to climax.  Libido intact.  PE; Blood pressure 131/79, pulse 78, temperature 98.2 F (36.8 C), temperature source Oral, resp. rate 16, height 5\' 8"  (1.727 m), weight 185 lb (83.9 kg), SpO2 98 %. Gen: Alert, well appearing.  Patient is oriented to person, place, time, and situation. AFFECT: pleasant, lucid thought and speech. ENT: Ears: EACs clear, normal epithelium.  TMs with good light reflex and landmarks bilaterally.  Eyes: no injection, icteris, swelling, or exudate.  EOMI, PERRLA. Nose: no drainage or turbinate edema/swelling.  No injection or focal lesion.  Mouth: lips without lesion/swelling.  Oral mucosa pink and moist.  Dentition intact and without obvious caries or gingival swelling.  Oropharynx without erythema, exudate, or swelling.  Neck: supple/nontender.  No LAD, mass, or TM.  Carotid pulses 2+ bilaterally, without bruits. CV: RRR, no m/r/g.   LUNGS: CTA bilat, nonlabored resps, good aeration in all lung fields. ABD: soft, NT, ND, BS normal.  No hepatospenomegaly or mass.  No bruits. EXT: no clubbing, cyanosis, or edema.  Right lower leg with soft tissue swelling over pes anserine region.  No ecchymoses. ROM of knee intact, knee without effusion. Musculoskeletal: no joint swelling, erythema, warmth, or tenderness.  ROM of all joints intact. Skin - no sores or suspicious lesions or rashes or color changes Rectal exam: negative without mass, lesions or tenderness, PROSTATE EXAM: smooth and symmetric without nodules or tenderness.   Pertinent labs:  Lab Results  Component Value Date   TSH 1.32 12/13/2016   Lab Results  Component Value Date   WBC 9.6 12/13/2016   HGB 15.2 12/13/2016   HCT 45.3 12/13/2016   MCV 84.7 12/13/2016   PLT 402.0 (H) 12/13/2016   Lab Results  Component Value Date   CREATININE 1.05 12/13/2016   BUN 11 12/13/2016   NA 140 12/13/2016    K 4.4 12/13/2016   CL 104 12/13/2016   CO2 26 12/13/2016   Lab Results  Component Value Date   ALT 29 12/13/2016   AST 20 12/13/2016   ALKPHOS 101 12/13/2016   BILITOT 0.4 12/13/2016   Lab Results  Component Value Date   CHOL 255 (H) 12/13/2016   Lab Results  Component Value Date   HDL 59.20 12/13/2016   Lab Results  Component Value Date   LDLCALC 163 (H) 12/13/2016   Lab Results  Component Value Date   TRIG 160.0 (H) 12/13/2016   Lab Results  Component Value Date   CHOLHDL 4 12/13/2016   Lab Results  Component Value Date   PSA 1.06 12/18/2016   PSA 0.65 02/19/2015   PSA 0.99 12/05/2013   Lab  Results  Component Value Date   LABURIC 7.5 12/13/2016    ASSESSMENT AND PLAN:   1) Right lower leg hematoma over pes anserine bursa area vs traumatic pes anserine bursitis. Conservative mgmt recommended: heat application qd-bid x 20 min.  2) Erectile dysfunction; libido intact. Start trial of generic viagra 20mg , 1-5 qd prn.  Therapeutic expectations and side effect profile of medication discussed today.  Patient's questions answered.  3) Hypercholesterolemia: intolerant of crestor and lipitor (classic myalgias).  Will start trial of 20mg  pravastatin qd.  4) Health maintenance exam: Reviewed age and gender appropriate health maintenance issues (prudent diet, regular exercise, health risks of tobacco and excessive alcohol, use of seatbelts, fire alarms in home, use of sunscreen).  Also reviewed age and gender appropriate health screening as well as vaccine recommendations. Vaccines: Tdap UTD.  Flu--pt declined this vaccine today. Labs: reviewed recent fasting HP labs + uric acid in detail with patient today. Prostate ca screening: DRE normal , PSA--forgot to order this with recent HP labs so I obtained this today. Colon cancer screening: next colonoscopy due 07/2020.  An After Visit Summary was printed and given to the patient.  FOLLOW UP:  Return in about 2 months  (around 02/17/2017) for f/u cholesterol/med (fasting).  Signed:  Crissie Sickles, MD           12/18/2016

## 2016-12-18 NOTE — Telephone Encounter (Signed)
Please advise. Thanks.  

## 2017-01-04 NOTE — Telephone Encounter (Signed)
Per Dr. Idelle Leech last office visit, he wanted pt to f/u in November to see how the new cholesterol medication was working. Pt needs to schedule f/u apt in November for RCI and fasting labs.

## 2017-01-04 NOTE — Telephone Encounter (Signed)
Patient advised, will call back to schedule when he has his calendar.

## 2017-02-24 ENCOUNTER — Ambulatory Visit: Payer: BLUE CROSS/BLUE SHIELD | Admitting: Family Medicine

## 2017-02-24 ENCOUNTER — Encounter: Payer: Self-pay | Admitting: Family Medicine

## 2017-02-24 VITALS — BP 120/76 | HR 78 | Temp 98.5°F | Resp 14 | Ht 68.0 in | Wt 179.0 lb

## 2017-02-24 DIAGNOSIS — M109 Gout, unspecified: Secondary | ICD-10-CM

## 2017-02-24 MED ORDER — PREDNISONE 10 MG PO TABS: ORAL_TABLET | ORAL | 0 refills | Status: DC

## 2017-02-24 NOTE — [facility] Notes (Signed)
Chief Complaint  Patient presents with  . Medication Refill    possible gout flare- would like prednisone     Subjective: Patient is a 61 y.o. male here for gout flare.  He has a history of gout with intermittent flares.  His left first MTP is usually affected.  It started around 3 days ago.  No change in medication or diet.  No injury or change in activity.  He is normally on prednisone for flares.   ROS: MSK: As noted in HPI  Family History  Problem Relation Age of Onset  . Cancer Mother 46       Colon Cancer  . Colon cancer Mother   . Heart disease Father   . Heart failure Father   . Hyperlipidemia Brother   . Hypertension Other   . Esophageal cancer Neg Hx   . Rectal cancer Neg Hx   . Stomach cancer Neg Hx    Past Medical History:  Diagnosis Date  . ALLERGIC RHINITIS 06/07/2007  . Altered consciousness 2016   MRI/MRA normal 2016.  Normal carotid dopplers 06/24/14.  Seen by neuro and felt NOT to be TIAs.  ? Freon exposure at his work.?  . ANEMIA-IRON DEFICIENCY 11/05/2007  . Cervical spondylosis without myelopathy 06/07/2007  . Diverticulosis   . Family history of colon cancer    in 1st deg, prior to age 58 yrs.  . Gout   . Hyperlipidemia 12/13/2013  . Irritable bowel syndrome 02/04/2010  . MYOFASCIAL PAIN SYNDROME 06/07/2007   Allergies  Allergen Reactions  . Crestor [Rosuvastatin]     Joint pain    Current Outpatient Medications:  .  pravastatin (PRAVACHOL) 20 MG tablet, Take 1 tablet (20 mg total) by mouth daily., Disp: 30 tablet, Rfl: 1 .  sildenafil (REVATIO) 20 MG tablet, 1-5 tabs po qd prn.  Take 30-60 min prior to intercourse., Disp: 50 tablet, Rfl: 1 .  predniSONE (DELTASONE) 10 MG tablet, 3 tabs by mouth per day for 3 days,2tabs per day for 3 days,1tab per day for 3 days, Disp: 18 tablet, Rfl: 0  Objective: BP 120/76 (BP Location: Left Arm, Patient Position: Sitting, Cuff Size: Large)   Pulse 78   Temp 98.5 F (36.9 C) (Oral)   Resp 14   Ht 5\' 8"  (1.727  m)   Wt 179 lb (81.2 kg)   SpO2 99%   BMI 27.22 kg/m  General: Awake, appears stated age Lungs: No accessory muscle use MSK: Left first MTP is mildly swollen, erythematous, warm, and tender to palpation Psych: Age appropriate judgment and insight, normal affect and mood  Assessment and Plan: Acute gout involving toe of left foot, unspecified cause  Pred taper.  F/u prn. The patient voiced understanding and agreement to the plan.  [place], DO 02/24/17  12:49 PM

## 2017-02-24 NOTE — Progress Notes (Signed)
Pre visit review using our clinic review tool, if applicable. No additional management support is needed unless otherwise documented below in the visit note. 

## 2017-02-24 NOTE — Patient Instructions (Signed)
OK to take Tylenol 1000 mg (2 extra strength tabs) or 975 mg (3 regular strength tabs) every 6 hours as needed.  Ice/cold pack over area for 10-15 min every 2-3 hours while awake.  Let us know if you need anything.

## 2017-04-08 DIAGNOSIS — M10072 Idiopathic gout, left ankle and foot: Secondary | ICD-10-CM | POA: Diagnosis not present

## 2017-07-02 DIAGNOSIS — M353 Polymyalgia rheumatica: Secondary | ICD-10-CM

## 2017-07-02 HISTORY — DX: Polymyalgia rheumatica: M35.3

## 2017-07-11 ENCOUNTER — Ambulatory Visit: Payer: BLUE CROSS/BLUE SHIELD | Admitting: Family Medicine

## 2017-07-11 ENCOUNTER — Encounter: Payer: Self-pay | Admitting: Family Medicine

## 2017-07-11 VITALS — BP 138/78 | HR 85 | Resp 16 | Ht 68.0 in | Wt 183.0 lb

## 2017-07-11 DIAGNOSIS — M353 Polymyalgia rheumatica: Secondary | ICD-10-CM

## 2017-07-11 LAB — CBC WITH DIFFERENTIAL/PLATELET
BASOS ABS: 0.1 10*3/uL (ref 0.0–0.1)
Basophils Relative: 1.2 % (ref 0.0–3.0)
Eosinophils Absolute: 0.1 10*3/uL (ref 0.0–0.7)
Eosinophils Relative: 2 % (ref 0.0–5.0)
HEMATOCRIT: 43.5 % (ref 39.0–52.0)
Hemoglobin: 14.8 g/dL (ref 13.0–17.0)
LYMPHS PCT: 23.2 % (ref 12.0–46.0)
Lymphs Abs: 1.7 10*3/uL (ref 0.7–4.0)
MCHC: 33.9 g/dL (ref 30.0–36.0)
MCV: 85.5 fl (ref 78.0–100.0)
MONOS PCT: 9.2 % (ref 3.0–12.0)
Monocytes Absolute: 0.7 10*3/uL (ref 0.1–1.0)
NEUTROS ABS: 4.7 10*3/uL (ref 1.4–7.7)
Neutrophils Relative %: 64.4 % (ref 43.0–77.0)
Platelets: 340 10*3/uL (ref 150.0–400.0)
RBC: 5.09 Mil/uL (ref 4.22–5.81)
RDW: 14.1 % (ref 11.5–15.5)
WBC: 7.3 10*3/uL (ref 4.0–10.5)

## 2017-07-11 LAB — CK: Total CK: 73 U/L (ref 7–232)

## 2017-07-11 LAB — SEDIMENTATION RATE: Sed Rate: 9 mm/hr (ref 0–20)

## 2017-07-11 NOTE — Patient Instructions (Signed)
Don't restart pravastatin until you've seen the rheumatologist.  Continue ibuprofen (advil) 3 tabs every 6 hours as needed for muscle pain.

## 2017-07-11 NOTE — [facility] Notes (Signed)
OFFICE VISIT  07/11/2017   CC:  Chief Complaint  Patient presents with  . Leg Pain    Bilteral thigh pain,     HPI:    Patient is a 62 y.o. male who presents for bilateral thigh pain. Pain/ache in front of thighs, across lower abdomen and groin, and across lower back diffusely.  Occ in gluteal region as well.  Started about 2.5 wks ago, occurs about once a day, lasts hours--advil helps it go away.  Nothing diff regarding activity prior to onset. Coffee cessation helped a little.  NSAID help well.  No rashes.  No joint pain swelling or pain. No GI upset, nausea, or diarrhea or constip. No fevers.  No signif fatigue with this.  No eye redness, no oral ulcers.  No signif dry mouth or dry eyes. He has not been taking his pravastatin b/c of aches and pains in joints in the past (not now)--no signif diff. He says he will be starting this back in future. I saw him 11/16/16 for similar complaints--extensive lab w/u including rheum testing neg at that time. NSAIDs recommended.  Recalls no preceding viral syndrome in the weeks leading up to these sx's.  Has hx of intolerance to crestor and lipitor due to joint and soft tissue pain.  Most recently started on pravastatin 12/2016.  Past Medical History:  Diagnosis Date  . ALLERGIC RHINITIS 06/07/2007  . Altered consciousness 2016   MRI/MRA normal 2016.  Normal carotid dopplers 06/24/14.  Seen by neuro and felt NOT to be TIAs.  ? Freon exposure at his work.?  . ANEMIA-IRON DEFICIENCY 11/05/2007  . Cervical spondylosis without myelopathy 06/07/2007  . Diverticulosis   . Family history of colon cancer    in 1st deg, prior to age 36 yrs.  . Gout   . Hyperlipidemia 12/13/2013  . Irritable bowel syndrome 02/04/2010  . MYOFASCIAL PAIN SYNDROME 06/07/2007    Past Surgical History:  Procedure Laterality Date  . Carotid dopplers  06/24/2014   Heterogeneous plaque, bilaterally (1-39%).  Normal subclavian and vertebrals.  Rpt 2 yrs.  . COLONOSCOPY   07/09/2015   Normal (Dr. Fuller Plan): repeat 5 yrs due to strong FH CC.  Marland Kitchen TRANSTHORACIC ECHOCARDIOGRAM  06/24/2014   EF 55-60%, LVH with normal LV size.  Normal wall motion. No valvular problems.    Outpatient Medications Prior to Visit  Medication Sig Dispense Refill  . aspirin EC 81 MG tablet Take 81 mg by mouth daily.    . sildenafil (REVATIO) 20 MG tablet 1-5 tabs po qd prn.  Take 30-60 min prior to intercourse. 50 tablet 1  . pravastatin (PRAVACHOL) 20 MG tablet Take 1 tablet (20 mg total) by mouth daily. (Patient not taking: Reported on 07/11/2017) 30 tablet 1  . predniSONE (DELTASONE) 10 MG tablet 3 tabs by mouth per day for 3 days,2tabs per day for 3 days,1tab per day for 3 days (Patient not taking: Reported on 07/11/2017) 18 tablet 0   No facility-administered medications prior to visit.     Allergies  Allergen Reactions  . Crestor [Rosuvastatin]     Joint pain    ROS As per HPI  PE: Blood pressure 138/78, pulse 85, resp. rate 16, height 5' 8" (1.727 m), weight 183 lb (83 kg), SpO2 98 %. Gen: Alert, well appearing.  Patient is oriented to person, place, time, and situation. AFFECT: pleasant, lucid thought and speech. HAF:BXUX: no injection, icteris, swelling, or exudate.  EOMI, PERRLA. Mouth: lips without lesion/swelling.  Oral mucosa  pink and moist. Oropharynx without erythema, exudate, or swelling.  Neck - No masses or thyromegaly or limitation in range of motion CV: RRR, no m/r/g.   LUNGS: CTA bilat, nonlabored resps, good aeration in all lung fields. ABD: soft, NT, ND, BS normal.  No hepatospenomegaly or mass.  No bruits. EXT: no clubbing, cyanosis, or edema.  Musculoskeletal: no joint swelling, erythema, warmth, or tenderness.  ROM of all joints intact. No soft tissue or muscle tenderness ANYWHERE today. Skin - no sores or suspicious lesions or rashes or color changes    LABS:    Chemistry      Component Value Date/Time   NA 140 12/13/2016 0802   K 4.4  12/13/2016 0802   CL 104 12/13/2016 0802   CO2 26 12/13/2016 0802   BUN 11 12/13/2016 0802   CREATININE 1.05 12/13/2016 0802      Component Value Date/Time   CALCIUM 9.7 12/13/2016 0802   ALKPHOS 101 12/13/2016 0802   AST 20 12/13/2016 0802   ALT 29 12/13/2016 0802   BILITOT 0.4 12/13/2016 0802     Lab Results  Component Value Date   WBC 9.6 12/13/2016   HGB 15.2 12/13/2016   HCT 45.3 12/13/2016   MCV 84.7 12/13/2016   PLT 402.0 (H) 12/13/2016   Lab Results  Component Value Date   TSH 1.32 12/13/2016   Lab Results  Component Value Date   CHOL 255 (H) 12/13/2016   HDL 59.20 12/13/2016   LDLCALC 163 (H) 12/13/2016   LDLDIRECT 192.5 12/05/2013   TRIG 160.0 (H) 12/13/2016   CHOLHDL 4 12/13/2016   Lab Results  Component Value Date   CKTOTAL 107 11/16/2016   Lab Results  Component Value Date   ESRSEDRATE 11 11/16/2016   Lab Results  Component Value Date   CRP 0.2 (L) 11/16/2016   ANA neg 11/16/16  IMPRESSION AND PLAN:  Polymyalgias, recurrent (11/2016 and current episode)--seemingly asymptomatic between these times. Rheum/autoimmune labs neg 11/2016. Unknown etiology (? Myofascial pain syndrome). Recheck CBC w/diff, ESR, and CPK. Refer to rheumatology.  An After Visit Summary was printed and given to the patient.  FOLLOW UP: Return for as needed.  Signed:  Crissie Sickles, MD           07/11/2017

## 2017-07-30 ENCOUNTER — Encounter: Payer: Self-pay | Admitting: Family Medicine

## 2017-07-31 ENCOUNTER — Encounter: Payer: Self-pay | Admitting: Family Medicine

## 2017-12-17 ENCOUNTER — Telehealth: Payer: Self-pay | Admitting: Family Medicine

## 2017-12-17 DIAGNOSIS — E7800 Pure hypercholesterolemia, unspecified: Secondary | ICD-10-CM

## 2017-12-17 DIAGNOSIS — Z125 Encounter for screening for malignant neoplasm of prostate: Secondary | ICD-10-CM

## 2017-12-17 DIAGNOSIS — Z862 Personal history of diseases of the blood and blood-forming organs and certain disorders involving the immune mechanism: Secondary | ICD-10-CM

## 2017-12-17 DIAGNOSIS — Z0000 Encounter for general adult medical examination without abnormal findings: Secondary | ICD-10-CM

## 2017-12-17 DIAGNOSIS — M1000 Idiopathic gout, unspecified site: Secondary | ICD-10-CM

## 2017-12-17 NOTE — Telephone Encounter (Signed)
Copied from [place] [id number]. Topic: Inquiry >> Dec 17, 2017  9:35 AM David Arias wrote: Reason for CRM: pt would like labs for his cpe; and include uric acid as well; contact pt to advise

## 2017-12-17 NOTE — Telephone Encounter (Signed)
OK, labs ordered.

## 2017-12-17 NOTE — Telephone Encounter (Signed)
Left message for patient okay to schedule lab appointment or go to [facility] lab without appt.

## 2017-12-19 ENCOUNTER — Encounter: Payer: BLUE CROSS/BLUE SHIELD | Admitting: Family Medicine

## 2017-12-21 ENCOUNTER — Other Ambulatory Visit (INDEPENDENT_AMBULATORY_CARE_PROVIDER_SITE_OTHER): Payer: BLUE CROSS/BLUE SHIELD

## 2017-12-21 DIAGNOSIS — E7800 Pure hypercholesterolemia, unspecified: Secondary | ICD-10-CM | POA: Diagnosis not present

## 2017-12-21 DIAGNOSIS — M1000 Idiopathic gout, unspecified site: Secondary | ICD-10-CM | POA: Diagnosis not present

## 2017-12-21 DIAGNOSIS — Z862 Personal history of diseases of the blood and blood-forming organs and certain disorders involving the immune mechanism: Secondary | ICD-10-CM | POA: Diagnosis not present

## 2017-12-21 DIAGNOSIS — Z125 Encounter for screening for malignant neoplasm of prostate: Secondary | ICD-10-CM

## 2017-12-21 DIAGNOSIS — Z0000 Encounter for general adult medical examination without abnormal findings: Secondary | ICD-10-CM | POA: Diagnosis not present

## 2017-12-21 LAB — CBC WITH DIFFERENTIAL/PLATELET
Basophils Absolute: 0.1 10*3/uL (ref 0.0–0.1)
Basophils Relative: 1 % (ref 0.0–3.0)
Eosinophils Absolute: 0.2 10*3/uL (ref 0.0–0.7)
Eosinophils Relative: 2.2 % (ref 0.0–5.0)
HEMATOCRIT: 44.3 % (ref 39.0–52.0)
Hemoglobin: 15.1 g/dL (ref 13.0–17.0)
LYMPHS ABS: 2.1 10*3/uL (ref 0.7–4.0)
Lymphocytes Relative: 20.3 % (ref 12.0–46.0)
MCHC: 34 g/dL (ref 30.0–36.0)
MCV: 82.6 fl (ref 78.0–100.0)
MONO ABS: 1 10*3/uL (ref 0.1–1.0)
Monocytes Relative: 9.2 % (ref 3.0–12.0)
NEUTROS PCT: 67.3 % (ref 43.0–77.0)
Neutro Abs: 7 10*3/uL (ref 1.4–7.7)
Platelets: 332 10*3/uL (ref 150.0–400.0)
RBC: 5.37 Mil/uL (ref 4.22–5.81)
RDW: 13.5 % (ref 11.5–15.5)
WBC: 10.4 10*3/uL (ref 4.0–10.5)

## 2017-12-21 LAB — COMPREHENSIVE METABOLIC PANEL
ALK PHOS: 117 U/L (ref 39–117)
ALT: 84 U/L — AB (ref 0–53)
AST: 46 U/L — ABNORMAL HIGH (ref 0–37)
Albumin: 4.4 g/dL (ref 3.5–5.2)
BILIRUBIN TOTAL: 0.6 mg/dL (ref 0.2–1.2)
BUN: 10 mg/dL (ref 6–23)
CO2: 31 mEq/L (ref 19–32)
CREATININE: 0.93 mg/dL (ref 0.40–1.50)
Calcium: 9.9 mg/dL (ref 8.4–10.5)
Chloride: 104 mEq/L (ref 96–112)
GFR: 87.18 mL/min (ref >60.00)
Glucose, Bld: 94 mg/dL (ref 70–99)
Potassium: 4.5 mEq/L (ref 3.5–5.1)
Sodium: 142 mEq/L (ref 135–145)
TOTAL PROTEIN: 7.7 g/dL (ref 6.0–8.3)

## 2017-12-21 LAB — LIPID PANEL
Cholesterol: 248 mg/dL — ABNORMAL HIGH (ref 0–200)
HDL: 52 mg/dL (ref >39.00)
LDL Cholesterol: 170 mg/dL — ABNORMAL HIGH (ref 0–99)
NonHDL: 196.18
Total CHOL/HDL Ratio: 5
Triglycerides: 132 mg/dL (ref 0.0–149.0)
VLDL: 26.4 mg/dL (ref 0.0–40.0)

## 2017-12-21 LAB — TSH: TSH: 0.9 u[IU]/mL (ref 0.35–4.50)

## 2017-12-21 LAB — PSA: PSA: 0.73 ng/mL (ref 0.10–4.00)

## 2017-12-21 LAB — URIC ACID: Uric Acid, Serum: 7.4 mg/dL (ref 4.0–7.8)

## 2017-12-24 ENCOUNTER — Other Ambulatory Visit: Payer: Self-pay | Admitting: Family Medicine

## 2017-12-24 ENCOUNTER — Other Ambulatory Visit: Payer: BLUE CROSS/BLUE SHIELD

## 2017-12-24 DIAGNOSIS — R7401 Elevation of levels of liver transaminase levels: Secondary | ICD-10-CM

## 2017-12-24 DIAGNOSIS — R740 Nonspecific elevation of levels of transaminase and lactic acid dehydrogenase [LDH]: Principal | ICD-10-CM

## 2017-12-25 LAB — HEPATITIS C ANTIBODY
Hepatitis C Ab: NONREACTIVE
SIGNAL TO CUT-OFF: 0.02 (ref <1.00)

## 2017-12-25 LAB — HEPATITIS B SURFACE ANTIGEN: HEP B S AG: NONREACTIVE

## 2017-12-27 ENCOUNTER — Encounter: Payer: Self-pay | Admitting: Family Medicine

## 2017-12-27 ENCOUNTER — Ambulatory Visit (INDEPENDENT_AMBULATORY_CARE_PROVIDER_SITE_OTHER): Payer: BLUE CROSS/BLUE SHIELD | Admitting: Family Medicine

## 2017-12-27 VITALS — BP 140/90 | HR 74 | Temp 98.1°F | Resp 16 | Ht 68.5 in | Wt 184.1 lb

## 2017-12-27 DIAGNOSIS — Z0000 Encounter for general adult medical examination without abnormal findings: Secondary | ICD-10-CM

## 2017-12-27 DIAGNOSIS — R350 Frequency of micturition: Secondary | ICD-10-CM

## 2017-12-27 DIAGNOSIS — E663 Overweight: Secondary | ICD-10-CM

## 2017-12-27 DIAGNOSIS — R109 Unspecified abdominal pain: Secondary | ICD-10-CM | POA: Diagnosis not present

## 2017-12-27 DIAGNOSIS — E7800 Pure hypercholesterolemia, unspecified: Secondary | ICD-10-CM | POA: Diagnosis not present

## 2017-12-27 LAB — POCT URINALYSIS DIPSTICK
Bilirubin, UA: NEGATIVE
Glucose, UA: NEGATIVE
Ketones, UA: NEGATIVE
LEUKOCYTES UA: NEGATIVE
Nitrite, UA: NEGATIVE
PH UA: 6 (ref 5.0–8.0)
Protein, UA: NEGATIVE
RBC UA: NEGATIVE
Spec Grav, UA: 1.015 (ref 1.010–1.025)
UROBILINOGEN UA: 0.2 U/dL

## 2017-12-27 NOTE — Patient Instructions (Signed)

## 2017-12-27 NOTE — [facility] Notes (Signed)
Office Note 12/27/2017  CC:  Chief Complaint  Patient presents with  . Annual Exam    Pt is not fasting.     HPI:  David Arias is a 62 y.o.  male who is here for annual health maintenance exam. Reviewed recent fasting HP labs + PSA with pt in detail today.  He does not check his bp at home.  Today's measurement is mildly elevated.  HLD:  He has been intolerant of crestor and lipitor.  Most recently we tried him on pravastatin and this caused same classic myalgias. Recent LDL 170.  No flare of gout in "a while".   Past Medical History:  Diagnosis Date  . ALLERGIC RHINITIS 06/07/2007  . Altered consciousness 2016   MRI/MRA normal 2016.  Normal carotid dopplers 06/24/14.  Seen by neuro and felt NOT to be TIAs.  ? Freon exposure at his work.?  . ANEMIA-IRON DEFICIENCY 11/05/2007  . Cervical spondylosis without myelopathy 06/07/2007  . Diverticulosis   . Family history of colon cancer    in 1st deg, prior to age 40 yrs.  . Gout   . Hyperlipidemia 12/13/2013  . Irritable bowel syndrome 02/04/2010  . MYOFASCIAL PAIN SYNDROME 06/07/2007  . Polymyalgia ([place]) 07/2017   Referred to rheum but pt no-showed.    Past Surgical History:  Procedure Laterality Date  . Carotid dopplers  06/24/2014   Heterogeneous plaque, bilaterally (1-39%).  Normal subclavian and vertebrals.  Rpt 2 yrs.  . COLONOSCOPY  07/09/2015   Normal (Dr. Fuller Plan): repeat 5 yrs due to strong FH CC.  Marland Kitchen TRANSTHORACIC ECHOCARDIOGRAM  06/24/2014   EF 55-60%, LVH with normal LV size.  Normal wall motion. No valvular problems.    Family History  Problem Relation Age of Onset  . Cancer Mother 56       Colon Cancer  . Colon cancer Mother   . Heart disease Father   . Heart failure Father   . Hyperlipidemia Brother   . Hypertension Other   . Esophageal cancer Neg Hx   . Rectal cancer Neg Hx   . Stomach cancer Neg Hx     Social History   Socioeconomic History  . Marital status: Divorced    Spouse name: Not on file   . Number of children: Not on file  . Years of education: Not on file  . Highest education level: Not on file  Occupational History  . Occupation: Environmental health practitioner: [employer]    Comment: Chief Financial Officer  Social Needs  . Financial resource strain: Not on file  . Food insecurity:    Worry: Not on file    Inability: Not on file  . Transportation needs:    Medical: Not on file    Non-medical: Not on file  Tobacco Use  . Smoking status: Never Smoker  . Smokeless tobacco: Never Used  Substance and Sexual Activity  . Alcohol use: Yes    Alcohol/week: 0.0 standard drinks    Comment: rarely  . Drug use: No  . Sexual activity: Yes    Partners: Male  Lifestyle  . Physical activity:    Days per week: Not on file    Minutes per session: Not on file  . Stress: Not on file  Relationships  . Social connections:    Talks on phone: Not on file    Gets together: Not on file    Attends religious service: Not on file    Active member of club or organization:  Not on file    Attends meetings of clubs or organizations: Not on file    Relationship status: Not on file  . Intimate partner violence:    Fear of current or ex partner: Not on file    Emotionally abused: Not on file    Physically abused: Not on file    Forced sexual activity: Not on file  Other Topics Concern  . Not on file  Social History Narrative   Single, 3 sons.   Educ: some college.   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   Tobacco: none   Alc: rare wine.   Drugs: none    Outpatient Medications Prior to Visit  Medication Sig Dispense Refill  . aspirin EC 81 MG tablet Take 81 mg by mouth daily.    . sildenafil (REVATIO) 20 MG tablet 1-5 tabs po qd prn.  Take 30-60 min prior to intercourse. 50 tablet 1  . pravastatin (PRAVACHOL) 20 MG tablet Take 1 tablet (20 mg total) by mouth daily. (Patient not taking: Reported on 07/11/2017) 30 tablet 1  . predniSONE (DELTASONE) 10 MG tablet 3 tabs by mouth per day for 3  days,2tabs per day for 3 days,1tab per day for 3 days (Patient not taking: Reported on 07/11/2017) 18 tablet 0   No facility-administered medications prior to visit.     Allergies  Allergen Reactions  . Crestor [Rosuvastatin]     Joint pain    ROS Review of Systems  Constitutional: Negative for appetite change, chills, fatigue and fever.  HENT: Negative for congestion, dental problem, ear pain and sore throat.   Eyes: Negative for discharge, redness and visual disturbance.  Respiratory: Negative for cough, chest tightness, shortness of breath and wheezing.   Cardiovascular: Negative for chest pain, palpitations and leg swelling.  Gastrointestinal: Negative for abdominal pain, blood in stool, diarrhea, nausea and vomiting.  Genitourinary: Positive for flank pain (left flank, extending to L back, also going into L groin sometimes--a few weeks.). Negative for difficulty urinating, dysuria, frequency, hematuria and urgency.       Urinary frequency  Musculoskeletal: Negative for arthralgias, back pain, joint swelling, myalgias and neck stiffness.  Skin: Negative for pallor and rash.  Neurological: Negative for dizziness, speech difficulty, weakness and headaches.  Hematological: Negative for adenopathy. Does not bruise/bleed easily.  Psychiatric/Behavioral: Negative for confusion and sleep disturbance. The patient is not nervous/anxious.     PE; initial bp 152/84, recheck manual was 140/90  Blood pressure (!) 152/84, pulse 74, temperature 98.1 F (36.7 C), temperature source Oral, resp. rate 16, height 5' 8.5" (1.74 m), weight 184 lb 2 oz (83.5 kg), SpO2 97 %. Body mass index is 27.59 kg/m.  Gen: Alert, well appearing.  Patient is oriented to person, place, time, and situation. AFFECT: pleasant, lucid thought and speech. ENT: Ears: EACs clear, normal epithelium.  TMs with good light reflex and landmarks bilaterally.  Eyes: no injection, icteris, swelling, or exudate.  EOMI,  PERRLA. Nose: no drainage or turbinate edema/swelling.  No injection or focal lesion.  Mouth: lips without lesion/swelling.  Oral mucosa pink and moist.  Dentition intact and without obvious caries or gingival swelling.  Oropharynx without erythema, exudate, or swelling.  Neck: supple/nontender.  No LAD, mass, or TM.  Carotid pulses 2+ bilaterally, without bruits. CV: RRR, no m/r/g.   LUNGS: CTA bilat, nonlabored resps, good aeration in all lung fields. ABD: soft, NT, ND, BS normal.  No hepatospenomegaly or mass.  No bruits.  No flank/cva tenderness.  No groin tenderness. EXT: no clubbing, cyanosis, or edema.  Musculoskeletal: no joint swelling, erythema, warmth, or tenderness.  ROM of all joints intact. Skin - no sores or suspicious lesions or rashes or color changes Rectal exam: negative without mass, lesions or tenderness, PROSTATE EXAM: smooth and symmetric without nodules or tenderness.   Pertinent labs:  Lab Results  Component Value Date   TSH 0.90 12/21/2017   Lab Results  Component Value Date   WBC 10.4 12/21/2017   HGB 15.1 12/21/2017   HCT 44.3 12/21/2017   MCV 82.6 12/21/2017   PLT 332.0 12/21/2017   Lab Results  Component Value Date   CREATININE 0.93 12/21/2017   BUN 10 12/21/2017   NA 142 12/21/2017   K 4.5 12/21/2017   CL 104 12/21/2017   CO2 31 12/21/2017   Lab Results  Component Value Date   ALT 84 (H) 12/21/2017   AST 46 (H) 12/21/2017   ALKPHOS 117 12/21/2017   BILITOT 0.6 12/21/2017   Lab Results  Component Value Date   CHOL 248 (H) 12/21/2017   Lab Results  Component Value Date   HDL 52.00 12/21/2017   Lab Results  Component Value Date   LDLCALC 170 (H) 12/21/2017   Lab Results  Component Value Date   TRIG 132.0 12/21/2017   Lab Results  Component Value Date   CHOLHDL 5 12/21/2017   Lab Results  Component Value Date   PSA 0.73 12/21/2017   PSA 1.06 12/18/2016   PSA 0.65 02/19/2015   Lab Results  Component Value Date   LABURIC  7.4 12/21/2017   POC UA today: completely normal.  ASSESSMENT AND PLAN:   1) Health maintenance exam: Reviewed age and gender appropriate health maintenance issues (prudent diet, regular exercise, health risks of tobacco and excessive alcohol, use of seatbelts, fire alarms in home, use of sunscreen).  Also reviewed age and gender appropriate health screening as well as vaccine recommendations. Vaccines: Flu vaccine-->pt declined.   Shingrix-->pt declined. Labs: Reviewed recent fasting HP labs + PSA with pt in detail today. Prostate ca screening: DRE normal , PSA 12/21/17 was normal. Colon ca screening:  Next colonoscopy due 2022.  2) Left side pain, mild and constant x 3 wks: exam normal.  UA today--> NORMAL.  Pt states he is minimally bothered by this. Watchful waiting approach to be taken at this time. Signs/symptoms to call or return for were reviewed and pt expressed understanding.  3) Hyperlipidemia: pt intolerant of 3 statins and declines trial of any further cholesterol meds at this time.  An After Visit Summary was printed and given to the patient.  FOLLOW UP:  Return in about 1 year (around 12/28/2018) for annual CPE with fasting labs (+ uric acid)  the week prior.  Signed:  Crissie Sickles, MD           12/27/2017

## 2018-01-01 DIAGNOSIS — H43812 Vitreous degeneration, left eye: Secondary | ICD-10-CM

## 2018-01-01 HISTORY — DX: Vitreous degeneration, left eye: H43.812

## 2018-01-15 DIAGNOSIS — H43392 Other vitreous opacities, left eye: Secondary | ICD-10-CM | POA: Diagnosis not present

## 2018-01-15 DIAGNOSIS — H2513 Age-related nuclear cataract, bilateral: Secondary | ICD-10-CM | POA: Diagnosis not present

## 2018-01-15 DIAGNOSIS — H43812 Vitreous degeneration, left eye: Secondary | ICD-10-CM | POA: Diagnosis not present

## 2018-03-03 ENCOUNTER — Encounter: Payer: Self-pay | Admitting: Family Medicine

## 2018-04-09 DIAGNOSIS — M7121 Synovial cyst of popliteal space [Baker], right knee: Secondary | ICD-10-CM | POA: Diagnosis not present

## 2018-04-09 DIAGNOSIS — M5431 Sciatica, right side: Secondary | ICD-10-CM | POA: Diagnosis not present

## 2019-01-09 DIAGNOSIS — M10071 Idiopathic gout, right ankle and foot: Secondary | ICD-10-CM | POA: Diagnosis not present

## 2019-01-22 ENCOUNTER — Telehealth: Payer: Self-pay | Admitting: Family Medicine

## 2019-01-22 NOTE — Telephone Encounter (Signed)
Patient reports he has blood in urine. Started 2 days ago. Patient concerned and requests to see Dr.McGowen today.    Can we schedule an acute work-in for today?   Please advise. Patient can be contacted on his cell [phone].  Thank you

## 2019-01-22 NOTE — Telephone Encounter (Signed)
Pt was called and denies fever. Pt has no other symptom besides blood in urine. No frequency or urgency, denies flank pain. Pt was scheduled for tomorrow morning with Dr Anitra Lauth, did not want to see another [facility] MD

## 2019-01-22 NOTE — Telephone Encounter (Signed)
Pt was called and message was left to return call. Dr Anitra Lauth has appt available tomorrow morning if patient would like to schedule

## 2019-01-23 ENCOUNTER — Encounter: Payer: Self-pay | Admitting: Family Medicine

## 2019-01-23 ENCOUNTER — Other Ambulatory Visit: Payer: Self-pay

## 2019-01-23 ENCOUNTER — Ambulatory Visit (INDEPENDENT_AMBULATORY_CARE_PROVIDER_SITE_OTHER): Payer: BC Managed Care – PPO | Admitting: Family Medicine

## 2019-01-23 VITALS — BP 138/87 | HR 84 | Temp 98.4°F | Resp 16 | Ht 68.5 in | Wt 183.0 lb

## 2019-01-23 DIAGNOSIS — R319 Hematuria, unspecified: Secondary | ICD-10-CM | POA: Diagnosis not present

## 2019-01-23 DIAGNOSIS — R300 Dysuria: Secondary | ICD-10-CM

## 2019-01-23 DIAGNOSIS — N410 Acute prostatitis: Secondary | ICD-10-CM | POA: Diagnosis not present

## 2019-01-23 DIAGNOSIS — B029 Zoster without complications: Secondary | ICD-10-CM | POA: Diagnosis not present

## 2019-01-23 LAB — POCT URINALYSIS DIPSTICK
Bilirubin, UA: NEGATIVE
Blood, UA: NEGATIVE
Glucose, UA: NEGATIVE
Ketones, UA: NEGATIVE
Leukocytes, UA: NEGATIVE
Nitrite, UA: NEGATIVE
Protein, UA: NEGATIVE
Spec Grav, UA: >=1.03 — AB (ref 1.010–1.025)
Urobilinogen, UA: 0.2 E.U./dL
pH, UA: 5.5 (ref 5.0–8.0)

## 2019-01-23 MED ORDER — SULFAMETHOXAZOLE-TRIMETHOPRIM 800-160 MG PO TABS: ORAL_TABLET | ORAL | 0 refills | Status: DC

## 2019-01-23 NOTE — [facility] Notes (Signed)
OFFICE VISIT  01/23/2019   CC:  Chief Complaint  Patient presents with  . Hematuria  . Dysuria    x3 days    HPI:    Patient is a 63 y.o.  male who presents for "blood in urine". About 3 wks ago had some diarrhea.  Returned to his baseline bowel habits after 3d->one BM q3d avg, usually large and firm but not constipation for him.  No fevers. Also onset of lower abd discomfort and fullness after he eats. Occ some aching across mid abd to L side chronically.  Three d/a he noted a few drops of blood come out at the end of urination. A few times after that he noted the same thing--AT THE END OF URINATION. +Dysuria last few days as well.  He's not sure about increased frequency or urinary urgency.  Stream is not as strong as usual and some sense of incomplete emptying is felt.  Typically urinates only 3 times per day at baseline.  No fevers.   He has never had this constellation of sx's before.  ROS: a little bit of mildly painful rash in R groin region onset about 1 wk, has had shingles in same area before.  Blisters not ruptured yet.  No CP, no SOB, no wheezing, no cough, no dizziness, no HAs, no rashes, no melena/hematochezia.  No polyuria or polydipsia.  No myalgias or arthralgias.  Review of past UA's in EMR shows trace blood on 2013 and 2014 dipsticks but no microscopy has ever been done. UA's in subsequent years have been entirely normal on dipstick.  Two urine clx a few years ago both with NO GROWTH.  04/02/2015 CT abd/pelv with contrast: NORMAL.  Past Medical History:  Diagnosis Date  . ALLERGIC RHINITIS 06/07/2007  . Altered consciousness 2016   MRI/MRA normal 2016.  Normal carotid dopplers 06/24/14.  Seen by neuro and felt NOT to be TIAs.  ? Freon exposure at his work.?  . ANEMIA-IRON DEFICIENCY 11/05/2007  . Cataract    OU  . Cervical spondylosis without myelopathy 06/07/2007  . Diverticulosis   . Family history of colon cancer    in 1st deg, prior to age 52 yrs.  . Gout    . Hyperlipidemia 12/13/2013   Intol of statins x 3 different meds; pt declines any further trials.  . Irritable bowel syndrome 02/04/2010  . MYOFASCIAL PAIN SYNDROME 06/07/2007  . Polymyalgia ([place]) 07/2017   Referred to rheum but pt no-showed.  . Posterior vitreous detachment of left eye 01/2018    Past Surgical History:  Procedure Laterality Date  . Carotid dopplers  06/24/2014   Heterogeneous plaque, bilaterally (1-39%).  Normal subclavian and vertebrals.  Rpt 2 yrs.  . COLONOSCOPY  07/09/2015   Normal (Dr. Fuller Plan): repeat 5 yrs due to strong FH CC.  Marland Kitchen TRANSTHORACIC ECHOCARDIOGRAM  06/24/2014   EF 55-60%, LVH with normal LV size.  Normal wall motion. No valvular problems.    Outpatient Medications Prior to Visit  Medication Sig Dispense Refill  . predniSONE (DELTASONE) 20 MG tablet Take 20 mg by mouth 2 (two) times daily.    Marland Kitchen aspirin EC 81 MG tablet Take 81 mg by mouth daily.    . sildenafil (REVATIO) 20 MG tablet 1-5 tabs po qd prn.  Take 30-60 min prior to intercourse. (Patient not taking: Reported on 01/23/2019) 50 tablet 1   No facility-administered medications prior to visit.     Allergies  Allergen Reactions  . Statins  Joint pain    ROS As per HPI  PE: Blood pressure 138/87, pulse 84, temperature 98.4 F (36.9 C), temperature source Temporal, resp. rate 16, height 5' 8.5" (1.74 m), weight 183 lb (83 kg), SpO2 94 %. Body mass index is 27.42 kg/m.  Gen: Alert, well appearing.  Patient is oriented to person, place, time, and situation. AFFECT: pleasant, lucid thought and speech. CV: RRR, no m/r/g.   LUNGS: CTA bilat, nonlabored resps, good aeration in all lung fields. ABD: soft, NT, ND, BS normal.  No hepatospenomegaly or mass.  No bruits. Skin of R groin region suprapubic level has one quarter sized patch of small vesicles. EXT: no clubbing or cyanosis.  no edema.  Rectal exam: negative without mass, lesions or tenderness, PROSTATE EXAM: smooth and  symmetric without nodules or tenderness, enlarged 1+.  Palpation of prostate does make him have urge to urinate.    LABS:    Chemistry      Component Value Date/Time   NA 142 12/21/2017 1417   K 4.5 12/21/2017 1417   CL 104 12/21/2017 1417   CO2 31 12/21/2017 1417   BUN 10 12/21/2017 1417   CREATININE 0.93 12/21/2017 1417      Component Value Date/Time   CALCIUM 9.9 12/21/2017 1417   ALKPHOS 117 12/21/2017 1417   AST 46 (H) 12/21/2017 1417   ALT 84 (H) 12/21/2017 1417   BILITOT 0.6 12/21/2017 1417     Lab Results  Component Value Date   WBC 10.4 12/21/2017   HGB 15.1 12/21/2017   HCT 44.3 12/21/2017   MCV 82.6 12/21/2017   PLT 332.0 12/21/2017   Lab Results  Component Value Date   TSH 0.90 12/21/2017   POC CC UA today: SG >1.030, otherwise normal.  IMPRESSION AND PLAN:  1) Dysuria, gross hematuria (end of urinating), some mild (new) lower urinary tract obstruction sx's, lower abd fullness.  Suspect acute prostatitis.  UA normal today which is reassuring. Bactrim DS 1 bid x 14d.  If not improved at f/u in 2 wks then will check CBC, CMET, and consider CT abd/pelv w and w/out contrast vs urology ref.  2) Herpes zoster: he is mildly symptomatic. No treatment at this time  An After Visit Summary was printed and given to the patient.  FOLLOW UP: Return in about 2 weeks (around 02/06/2019) for f/u hematuria/?prostatitis. He is overdue for RCI/CPE->he'll make a separate f/u for this in near future.  Signed:  Crissie Sickles, MD           01/23/2019

## 2019-01-24 ENCOUNTER — Telehealth: Payer: Self-pay | Admitting: Family Medicine

## 2019-01-24 LAB — URINE CULTURE
MICRO NUMBER:: 1019220
Result:: NO GROWTH
SPECIMEN QUALITY:: ADEQUATE

## 2019-01-24 MED ORDER — LEVOFLOXACIN 500 MG PO TABS: 500.0000 mg | ORAL_TABLET | Freq: Every day | ORAL | 0 refills | Status: DC

## 2019-01-24 NOTE — Telephone Encounter (Signed)
Per Dr Anitra Lauth he is to stop the other medication and start Levaquin 500mg  1 tablet daily x14 days. Sent to pharmacy. Pt made aware of change and verbalized understanding. Pt was made aware to visit urgent care or ED if he had allergic reaction or symptoms continued, he verbalized understanding

## 2019-01-24 NOTE — Telephone Encounter (Signed)
Noted agree

## 2019-01-24 NOTE — Telephone Encounter (Signed)
Patient called saying that he had an allergic reaction to the Bactrim DS that was prescribed to him on yesterday. He says that he had all the side effects on the medication. For example, sever sweats, chills, muscle weakness, joint pain. He states that he about passed out standing up yesterday. Patient is wanting a call back to discuss a medication that he took before to see if he could start on that medication. Patient is not going to continue to take the Bactrim DS. Please advise.

## 2019-01-24 NOTE — Telephone Encounter (Signed)
Please advise if there is something else he can take?

## 2019-01-26 DIAGNOSIS — M7122 Synovial cyst of popliteal space [Baker], left knee: Secondary | ICD-10-CM | POA: Diagnosis not present

## 2019-01-26 DIAGNOSIS — M5432 Sciatica, left side: Secondary | ICD-10-CM | POA: Diagnosis not present

## 2019-02-10 ENCOUNTER — Ambulatory Visit: Payer: BC Managed Care – PPO | Admitting: Family Medicine

## 2019-02-17 ENCOUNTER — Ambulatory Visit: Payer: BC Managed Care – PPO | Admitting: Family Medicine

## 2019-03-11 ENCOUNTER — Encounter: Payer: Self-pay | Admitting: Family Medicine

## 2019-03-11 ENCOUNTER — Other Ambulatory Visit: Payer: Self-pay

## 2019-03-11 ENCOUNTER — Ambulatory Visit (INDEPENDENT_AMBULATORY_CARE_PROVIDER_SITE_OTHER): Payer: BC Managed Care – PPO | Admitting: Family Medicine

## 2019-03-11 VITALS — BP 146/90 | HR 83 | Temp 98.3°F | Resp 16 | Ht 68.5 in | Wt 187.0 lb

## 2019-03-11 DIAGNOSIS — Z125 Encounter for screening for malignant neoplasm of prostate: Secondary | ICD-10-CM | POA: Diagnosis not present

## 2019-03-11 DIAGNOSIS — R030 Elevated blood-pressure reading, without diagnosis of hypertension: Secondary | ICD-10-CM | POA: Diagnosis not present

## 2019-03-11 DIAGNOSIS — R7401 Elevation of levels of liver transaminase levels: Secondary | ICD-10-CM

## 2019-03-11 DIAGNOSIS — E7800 Pure hypercholesterolemia, unspecified: Secondary | ICD-10-CM

## 2019-03-11 DIAGNOSIS — I6523 Occlusion and stenosis of bilateral carotid arteries: Secondary | ICD-10-CM | POA: Diagnosis not present

## 2019-03-11 DIAGNOSIS — Z0000 Encounter for general adult medical examination without abnormal findings: Secondary | ICD-10-CM

## 2019-03-11 LAB — COMPREHENSIVE METABOLIC PANEL
ALT: 16 U/L (ref 0–53)
AST: 12 U/L (ref 0–37)
Albumin: 4.2 g/dL (ref 3.5–5.2)
Alkaline Phosphatase: 90 U/L (ref 39–117)
BUN: 15 mg/dL (ref 6–23)
CO2: 32 mEq/L (ref 19–32)
Calcium: 9.9 mg/dL (ref 8.4–10.5)
Chloride: 102 mEq/L (ref 96–112)
Creatinine, Ser: 1.01 mg/dL (ref 0.40–1.50)
GFR: 74.29 mL/min (ref >60.00)
Glucose, Bld: 89 mg/dL (ref 70–99)
Potassium: 5 mEq/L (ref 3.5–5.1)
Sodium: 142 mEq/L (ref 135–145)
Total Bilirubin: 0.4 mg/dL (ref 0.2–1.2)
Total Protein: 6.8 g/dL (ref 6.0–8.3)

## 2019-03-11 LAB — LIPID PANEL
Cholesterol: 285 mg/dL — ABNORMAL HIGH (ref 0–200)
HDL: 61.5 mg/dL (ref >39.00)
NonHDL: 223.48
Total CHOL/HDL Ratio: 5
Triglycerides: 221 mg/dL — ABNORMAL HIGH (ref 0.0–149.0)
VLDL: 44.2 mg/dL — ABNORMAL HIGH (ref 0.0–40.0)

## 2019-03-11 LAB — CBC WITH DIFFERENTIAL/PLATELET
Basophils Absolute: 0.1 10*3/uL (ref 0.0–0.1)
Basophils Relative: 0.8 % (ref 0.0–3.0)
Eosinophils Absolute: 0.3 10*3/uL (ref 0.0–0.7)
Eosinophils Relative: 2.7 % (ref 0.0–5.0)
HCT: 44.3 % (ref 39.0–52.0)
Hemoglobin: 14.7 g/dL (ref 13.0–17.0)
Lymphocytes Relative: 32 % (ref 12.0–46.0)
Lymphs Abs: 3.4 10*3/uL (ref 0.7–4.0)
MCHC: 33.1 g/dL (ref 30.0–36.0)
MCV: 85.8 fl (ref 78.0–100.0)
Monocytes Absolute: 1 10*3/uL (ref 0.1–1.0)
Monocytes Relative: 9 % (ref 3.0–12.0)
Neutro Abs: 5.9 10*3/uL (ref 1.4–7.7)
Neutrophils Relative %: 55.5 % (ref 43.0–77.0)
Platelets: 417 10*3/uL — ABNORMAL HIGH (ref 150.0–400.0)
RBC: 5.16 Mil/uL (ref 4.22–5.81)
RDW: 14.6 % (ref 11.5–15.5)
WBC: 10.7 10*3/uL — ABNORMAL HIGH (ref 4.0–10.5)

## 2019-03-11 LAB — LDL CHOLESTEROL, DIRECT: Direct LDL: 185 mg/dL

## 2019-03-11 LAB — PSA: PSA: 1.68 ng/mL (ref 0.10–4.00)

## 2019-03-11 NOTE — [facility] Notes (Signed)
Office Note 03/11/2019  CC:  Chief Complaint  Patient presents with  . Annual Exam    pt is fasting   HPI:  David Arias is a 63 y.o. male who is here for annual health maintenance exam.  3 sons: happy and well. 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    Exercise: normal daily routine is active, no formal exercise. Diet: healthy Takes an "immune booster" daily called "immune".    Past Medical History:  Diagnosis Date  . ALLERGIC RHINITIS 06/07/2007  . Altered consciousness 2016   MRI/MRA normal 2016.  Normal carotid dopplers 06/24/14.  Seen by neuro and felt NOT to be TIAs.  ? Freon exposure at his work.?  . ANEMIA-IRON DEFICIENCY 11/05/2007  . Cataract    OU  . Cervical spondylosis without myelopathy 06/07/2007  . Diverticulosis   . Family history of colon cancer    in 1st deg, prior to age 19 yrs.  . Gout   . Hyperlipidemia 12/13/2013   Intol of statins x 3 different meds; pt declines any further trials.  . Irritable bowel syndrome 02/04/2010  . MYOFASCIAL PAIN SYNDROME 06/07/2007  . Polymyalgia ([place]) 07/2017   Referred to rheum but pt no-showed.  . Posterior vitreous detachment of left eye 01/2018    Past Surgical History:  Procedure Laterality Date  . Carotid dopplers  06/24/2014   Heterogeneous plaque, bilaterally (1-39%).  Normal subclavian and vertebrals.  Rpt 2 yrs.  . COLONOSCOPY  07/09/2015   Normal (Dr. Fuller Plan): repeat 5 yrs due to strong FH CC.  Marland Kitchen TRANSTHORACIC ECHOCARDIOGRAM  06/24/2014   EF 55-60%, LVH with normal LV size.  Normal wall motion. No valvular problems.    Family History  Problem Relation Age of Onset  . Cancer Mother 99       Colon Cancer  . Colon cancer Mother   . Heart disease Father   . Heart failure Father   . Hyperlipidemia Brother   . Hypertension Other   . Esophageal cancer Neg Hx   . Rectal cancer Neg Hx   . Stomach cancer Neg Hx     Social History   Socioeconomic History  . Marital status: Divorced    Spouse name: Not on  file  . Number of children: Not on file  . Years of education: Not on file  . Highest education level: Not on file  Occupational History  . Occupation: Environmental health practitioner: [employer]    Comment: Chief Financial Officer  Social Needs  . Financial resource strain: Not on file  . Food insecurity    Worry: Not on file    Inability: Not on file  . Transportation needs    Medical: Not on file    Non-medical: Not on file  Tobacco Use  . Smoking status: Never Smoker  . Smokeless tobacco: Never Used  Substance and Sexual Activity  . Alcohol use: Yes    Alcohol/week: 0.0 standard drinks    Comment: rarely  . Drug use: No  . Sexual activity: Yes    Partners: Male  Lifestyle  . Physical activity    Days per week: Not on file    Minutes per session: Not on file  . Stress: Not on file  Relationships  . Social Herbalist on phone: Not on file    Gets together: Not on file    Attends religious service: Not on file    Active member of club or organization: Not on file  Attends meetings of clubs or organizations: Not on file    Relationship status: Not on file  . Intimate partner violence    Fear of current or ex partner: Not on file    Emotionally abused: Not on file    Physically abused: Not on file    Forced sexual activity: Not on file  Other Topics Concern  . Not on file  Social History Narrative   Single, 3 sons.   Educ: some college.   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   Tobacco: none   Alc: rare wine.   Drugs: none    Outpatient Medications Prior to Visit  Medication Sig Dispense Refill  . Multiple Vitamins-Minerals (IMMUNE SYSTEM BOOSTER PO) Take by mouth as needed.    Marland Kitchen levofloxacin (LEVAQUIN) 500 MG tablet Take 1 tablet (500 mg total) by mouth daily. 14 tablet 0  . predniSONE (DELTASONE) 20 MG tablet Take 20 mg by mouth 2 (two) times daily.    Marland Kitchen sulfamethoxazole-trimethoprim (BACTRIM DS) 800-160 MG tablet 1 tab po bid x 14 days 28 tablet 0   No  facility-administered medications prior to visit.     Allergies  Allergen Reactions  . Bactrim Ds [Sulfamethoxazole-Trimethoprim] Anaphylaxis and Swelling  . Levaquin [Levofloxacin] Other (See Comments)    Tendon pain  . Statins     Joint pain    ROS Review of Systems  Constitutional: Negative for appetite change, chills, fatigue and fever.  HENT: Negative for congestion, dental problem, ear pain and sore throat.   Eyes: Negative for discharge, redness and visual disturbance.  Respiratory: Negative for cough, chest tightness, shortness of breath and wheezing.   Cardiovascular: Negative for chest pain, palpitations and leg swelling.  Gastrointestinal: Negative for abdominal pain, blood in stool, diarrhea, nausea and vomiting.  Genitourinary: Negative for difficulty urinating, dysuria, flank pain, frequency, hematuria and urgency.  Musculoskeletal: Negative for arthralgias, back pain, joint swelling, myalgias and neck stiffness.  Skin: Negative for pallor and rash.  Neurological: Negative for dizziness, speech difficulty, weakness and headaches.  Hematological: Negative for adenopathy. Does not bruise/bleed easily.  Psychiatric/Behavioral: Negative for confusion and sleep disturbance. The patient is not nervous/anxious.     PE; Initial bp 137/83 automated cuff.  Repeat manual cuff at end of visit was 146/90 Blood pressure (!) 137/93, pulse 83, temperature 98.3 F (36.8 C), temperature source Temporal, resp. rate 16, height 5' 8.5" (1.74 m), weight 187 lb (84.8 kg), SpO2 96 %. Body mass index is 28.02 kg/m.  Gen: Alert, well appearing.  Patient is oriented to person, place, time, and situation. AFFECT: pleasant, lucid thought and speech. ENT: Ears: EACs clear, normal epithelium.  TMs with good light reflex and landmarks bilaterally.  Eyes: no injection, icteris, swelling, or exudate.  EOMI, PERRLA. Nose: no drainage or turbinate edema/swelling.  No injection or focal lesion.  Mouth:  lips without lesion/swelling.  Oral mucosa pink and moist.  Dentition intact and without obvious caries or gingival swelling.  Oropharynx without erythema, exudate, or swelling.  Neck: supple/nontender.  No LAD, mass, or TM.  Carotid pulses 2+ bilaterally, without bruits. CV: RRR, no m/r/g.   LUNGS: CTA bilat, nonlabored resps, good aeration in all lung fields. ABD: soft, NT, ND, BS normal.  No hepatospenomegaly or mass.  No bruits. EXT: no clubbing, cyanosis, or edema.  Musculoskeletal: no joint swelling, erythema, warmth, or tenderness.  ROM of all joints intact. Skin - no sores or suspicious lesions or rashes or color changes   Pertinent labs:  Lab Results  Component Value Date   TSH 0.90 12/21/2017   Lab Results  Component Value Date   WBC 10.4 12/21/2017   HGB 15.1 12/21/2017   HCT 44.3 12/21/2017   MCV 82.6 12/21/2017   PLT 332.0 12/21/2017   Lab Results  Component Value Date   CREATININE 0.93 12/21/2017   BUN 10 12/21/2017   NA 142 12/21/2017   K 4.5 12/21/2017   CL 104 12/21/2017   CO2 31 12/21/2017   Lab Results  Component Value Date   ALT 84 (H) 12/21/2017   AST 46 (H) 12/21/2017   ALKPHOS 117 12/21/2017   BILITOT 0.6 12/21/2017   Lab Results  Component Value Date   CHOL 248 (H) 12/21/2017   Lab Results  Component Value Date   HDL 52.00 12/21/2017   Lab Results  Component Value Date   LDLCALC 170 (H) 12/21/2017   Lab Results  Component Value Date   TRIG 132.0 12/21/2017   Lab Results  Component Value Date   CHOLHDL 5 12/21/2017   Lab Results  Component Value Date   PSA 0.73 12/21/2017   PSA 1.06 12/18/2016   PSA 0.65 02/19/2015    ASSESSMENT AND PLAN:   1) Hx of mild Carotid artery stenosis bilat->due for 2 yr repeat of dopplers-->ordered today.  2) elevated bp w/out dx of HTN: discussed buying home cuff and monitoring bp/hr daily, review numbers with me at f/u visit 2 wks, virtual.  3) Health maintenance exam: Reviewed age and gender  appropriate health maintenance issues (prudent diet, regular exercise, health risks of tobacco and excessive alcohol, use of seatbelts, fire alarms in home, use of sunscreen).  Also reviewed age and gender appropriate health screening as well as vaccine recommendations. Vaccines: flu->declines.  Shingrix discussed->he declines. Labs: CBC, CMET, FLP, PSA. Prostate ca screening: DRE , PSA. Colon ca screening: next colonoscopy due 07/2020.  An After Visit Summary was printed and given to the patient.  FOLLOW UP:  No follow-ups on file.  Signed:  Crissie Sickles, MD           03/11/2019

## 2019-03-11 NOTE — Patient Instructions (Signed)

## 2019-03-12 ENCOUNTER — Telehealth (HOSPITAL_COMMUNITY): Payer: Self-pay | Admitting: *Deleted

## 2019-03-12 NOTE — Telephone Encounter (Signed)
Left VM asking for call back to schedule appt,

## 2019-03-13 ENCOUNTER — Telehealth (HOSPITAL_COMMUNITY): Payer: Self-pay | Admitting: *Deleted

## 2019-03-13 NOTE — Telephone Encounter (Signed)
Left VM asking for call back

## 2019-03-14 ENCOUNTER — Telehealth (HOSPITAL_COMMUNITY): Payer: Self-pay | Admitting: *Deleted

## 2019-03-14 NOTE — Telephone Encounter (Signed)
Informed office I am unable to contact Mr. Feinberg to schedule appt.

## 2019-03-19 ENCOUNTER — Encounter: Payer: Self-pay | Admitting: Family Medicine

## 2019-03-19 ENCOUNTER — Other Ambulatory Visit: Payer: Self-pay

## 2019-03-19 ENCOUNTER — Telehealth: Payer: Self-pay | Admitting: Family Medicine

## 2019-03-19 DIAGNOSIS — E7800 Pure hypercholesterolemia, unspecified: Secondary | ICD-10-CM

## 2019-03-19 MED ORDER — EZETIMIBE 10 MG PO TABS: 10.0000 mg | ORAL_TABLET | Freq: Every day | ORAL | 2 refills | Status: DC

## 2019-03-19 NOTE — Telephone Encounter (Signed)
Patient returning call regarding his test results.  Please call patient at [phone]

## 2019-03-19 NOTE — Telephone Encounter (Signed)
Patient has been advised of lab results.  °

## 2019-03-25 ENCOUNTER — Ambulatory Visit: Payer: BC Managed Care – PPO | Admitting: Family Medicine

## 2019-04-11 ENCOUNTER — Ambulatory Visit: Payer: BC Managed Care – PPO | Admitting: Family Medicine

## 2019-04-22 ENCOUNTER — Telehealth: Payer: Self-pay

## 2019-04-22 DIAGNOSIS — I6529 Occlusion and stenosis of unspecified carotid artery: Secondary | ICD-10-CM

## 2019-04-22 NOTE — Telephone Encounter (Signed)
Is this appropriate to re-order?

## 2019-04-22 NOTE — Telephone Encounter (Signed)
Patient was unable to get vascular done. Order has expired. Please enter order through [facility].

## 2019-04-23 NOTE — Telephone Encounter (Signed)
Order has been placed.

## 2019-04-23 NOTE — Telephone Encounter (Signed)
Yes, can you re-order it?

## 2019-04-24 ENCOUNTER — Ambulatory Visit: Payer: BC Managed Care – PPO | Admitting: Family Medicine

## 2019-04-29 ENCOUNTER — Ambulatory Visit (HOSPITAL_COMMUNITY): Payer: BC Managed Care – PPO

## 2019-05-02 ENCOUNTER — Ambulatory Visit (HOSPITAL_COMMUNITY): Payer: BC Managed Care – PPO

## 2019-09-01 DIAGNOSIS — M10072 Idiopathic gout, left ankle and foot: Secondary | ICD-10-CM | POA: Diagnosis not present

## 2019-09-01 DIAGNOSIS — M7742 Metatarsalgia, left foot: Secondary | ICD-10-CM | POA: Diagnosis not present

## 2019-12-16 ENCOUNTER — Other Ambulatory Visit: Payer: Self-pay | Admitting: Family Medicine

## 2019-12-26 DIAGNOSIS — U071 COVID-19: Secondary | ICD-10-CM | POA: Diagnosis not present

## 2020-10-25 ENCOUNTER — Ambulatory Visit (INDEPENDENT_AMBULATORY_CARE_PROVIDER_SITE_OTHER): Payer: BC Managed Care – PPO

## 2020-10-25 ENCOUNTER — Ambulatory Visit: Payer: Self-pay

## 2020-10-25 ENCOUNTER — Ambulatory Visit (INDEPENDENT_AMBULATORY_CARE_PROVIDER_SITE_OTHER): Payer: BC Managed Care – PPO | Admitting: Family Medicine

## 2020-10-25 ENCOUNTER — Encounter: Payer: Self-pay | Admitting: Family Medicine

## 2020-10-25 ENCOUNTER — Other Ambulatory Visit: Payer: Self-pay

## 2020-10-25 VITALS — BP 140/70 | HR 106 | Ht 68.0 in | Wt 184.0 lb

## 2020-10-25 DIAGNOSIS — M25571 Pain in right ankle and joints of right foot: Secondary | ICD-10-CM

## 2020-10-25 DIAGNOSIS — M76821 Posterior tibial tendinitis, right leg: Secondary | ICD-10-CM

## 2020-10-25 NOTE — Assessment & Plan Note (Signed)
Appears to be more of a posterior tibialis tendinitis.  No true tear appreciated.  Discussed heel lift, icing regimen, topical anti-inflammatories.  No significant gout noted today though.  Patient is still concerned.  That this could contribute.  Patient will increase activity slowly.  Follow-up with me again in 4 to 6 weeks.

## 2020-10-25 NOTE — [facility] Notes (Signed)
[place] [facility] [place] Phone: [phone] Subjective:   Fontaine No, am serving as a scribe for Dr. Hulan Saas.  This visit occurred during the SARS-CoV-2 public health emergency.  Safety protocols were in place, including screening questions prior to the visit, additional usage of staff PPE, and extensive cleaning of exam room while observing appropriate contact time as indicated for disinfecting solutions.   I'm seeing this patient by the request  of:  McGowen, Adrian Blackwater, MD  CC: Right Achilles pain follow-up  [id number]  Esau Wilkin is a 65 y.o. male coming in with complaint of R achilles pain for one week. Patient declines to tell me how he hurt his ankle. Patient had sharp pain with injury. No pain any longer. Swelling is still there he states. Patient feels like he has had to alter his gait.      Past Medical History:  Diagnosis Date   ALLERGIC RHINITIS 06/07/2007   Altered consciousness 2016   MRI/MRA normal 2016.  Normal carotid dopplers 06/24/14.  Seen by neuro and felt NOT to be TIAs.  ? Freon exposure at his work.?   ANEMIA-IRON DEFICIENCY 11/05/2007   Cataract    OU   Cervical spondylosis without myelopathy 06/07/2007   Diverticulosis    Family history of colon cancer    in 1st deg, prior to age 39 yrs.   Gout    Hyperlipidemia 12/13/2013   Intol of statins x 3 different meds; pt declines any further trials.  Zetia trial 03/2019   Irritable bowel syndrome 02/04/2010   MYOFASCIAL PAIN SYNDROME 06/07/2007   Polymyalgia ([place]) 07/2017   Referred to rheum but pt no-showed.   Posterior vitreous detachment of left eye 01/2018   Past Surgical History:  Procedure Laterality Date   Carotid dopplers  06/24/2014   Heterogeneous plaque, bilaterally (1-39%).  Normal subclavian and vertebrals.  Rpt 2 yrs.   COLONOSCOPY  07/09/2015   Normal (Dr. Fuller Plan): repeat 5 yrs due to strong FH CC.   TRANSTHORACIC  ECHOCARDIOGRAM  06/24/2014   EF 55-60%, LVH with normal LV size.  Normal wall motion. No valvular problems.   Social History   Socioeconomic History   Marital status: Divorced    Spouse name: Not on file   Number of children: Not on file   Years of education: Not on file   Highest education level: Not on file  Occupational History   Occupation: TECHNICIAN    Employer: [employer]    Comment: Chief Financial Officer  Tobacco Use   Smoking status: Never   Smokeless tobacco: Never  Substance and Sexual Activity   Alcohol use: Yes    Alcohol/week: 0.0 standard drinks    Comment: rarely   Drug use: No   Sexual activity: Yes    Partners: Male  Other Topics Concern   Not on file  Social History Narrative   Single, 3 sons.   Educ: some college.   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   Tobacco: none   Alc: rare wine.   Drugs: none   Social Determinants of Radio broadcast assistant Strain: Not on file  Food Insecurity: Not on file  Transportation Needs: Not on file  Physical Activity: Not on file  Stress: Not on file  Social Connections: Not on file   Allergies  Allergen Reactions   Bactrim Ds [Sulfamethoxazole-Trimethoprim] Anaphylaxis and Swelling   Levaquin [Levofloxacin] Other (See Comments)    Tendon pain  Statins     Joint pain   Family History  Problem Relation Age of Onset   Cancer Mother 42       Colon Cancer   Colon cancer Mother    Heart disease Father    Heart failure Father    Hyperlipidemia Brother    Hypertension Other    Esophageal cancer Neg Hx    Rectal cancer Neg Hx    Stomach cancer Neg Hx      Current Outpatient Medications (Cardiovascular):    ezetimibe (ZETIA) 10 MG tablet, Take 1 tablet (10 mg total) by mouth daily.     Current Outpatient Medications (Other):    Multiple Vitamins-Minerals (IMMUNE SYSTEM BOOSTER PO), Take by mouth as needed.   Reviewed prior external information including notes and imaging from  primary care  provider As well as notes that were available from care everywhere and other healthcare systems.  Past medical history, social, surgical and family history all reviewed in electronic medical record.  No pertanent information unless stated regarding to the chief complaint.   Review of Systems:  No headache, visual changes, nausea, vomiting, diarrhea, constipation, dizziness, abdominal pain, skin rash, fevers, chills, night sweats, weight loss, swollen lymph nodes, body aches, joint swelling, chest pain, shortness of breath, mood changes. POSITIVE muscle aches  Objective  Blood pressure 140/70, pulse (!) 106, height '5\' 8"'$  (1.727 m), weight 184 lb (83.5 kg), SpO2 96 %.   General: No apparent distress alert and oriented x3 mood and affect normal, dressed appropriately.  HEENT: Pupils equal, extraocular movements intact  Respiratory: Patient's speak in full sentences and does not appear short of breath  Cardiovascular: No lower extremity edema, non tender, no erythema  Gait mild antalgic Right ankle exam shows the patient does have full flexion and extension noted.  Mild underlying arthritic changes noted of the ankle joint.  Mild tenderness over the posterior tibialis tendon.  Haglund nodule noted bilaterally right greater than left.  Limited muscular skeletal ultrasound was performed and interpreted by Hulan Saas, M  Limited ultrasound shows the patient's Achilles is unremarkable except for a posterior calcaneal bone spur noted.  No acute findings noted.  No hypoechoic changes.  Patient noted does have increasing Doppler flow in mild soft tissue swelling on the medial aspect just superficial to the posterior tibialis tendon with hypoechoic changes within the tendon sheath as well. Impression: Haglund nodule with bone spur as well as posterior tibialis tendinitis   [id number]; 15 additional minutes spent for Therapeutic exercises as stated in above notes.  This included exercises focusing on  stretching, strengthening, with significant focus on eccentric aspects.   Long term goals include an improvement in range of motion, strength, endurance as well as avoiding reinjury. Patient's frequency would include in 1-2 times a day, 3-5 times a week for a duration of 6-12 weeks. Ankle strengthening that included:  Basic range of motion exercises to allow proper full motion at ankle Stretching of the lower leg and hamstrings  Theraband exercises for the lower leg - inversion, eversion, dorsiflexion and plantarflexion each to be completed with a theraband Balance exercises to increase proprioception Weight bearing exercises to increase strength and balance  Proper technique shown and discussed handout in great detail with ATC.  All questions were discussed and answered.     Impression and Recommendations:     The above documentation has been reviewed and is accurate and complete Lyndal Pulley, DO

## 2020-10-25 NOTE — Patient Instructions (Signed)
Posterior tib tendonitis  Heel lift in shoes HOKA recovery sandals Pennsaid and switch to Voltaren Xray on way out See me in 6 weeks

## 2020-12-13 ENCOUNTER — Ambulatory Visit: Payer: BC Managed Care – PPO | Admitting: Family Medicine

## 2021-01-25 ENCOUNTER — Encounter: Payer: Self-pay | Admitting: Gastroenterology

## 2021-02-28 ENCOUNTER — Encounter: Payer: Self-pay | Admitting: Gastroenterology

## 2021-02-28 ENCOUNTER — Ambulatory Visit (AMBULATORY_SURGERY_CENTER): Payer: BC Managed Care – PPO

## 2021-02-28 ENCOUNTER — Other Ambulatory Visit: Payer: Self-pay

## 2021-02-28 VITALS — Ht 68.0 in | Wt 179.0 lb

## 2021-02-28 DIAGNOSIS — Z800 Family history of malignant neoplasm of digestive organs: Secondary | ICD-10-CM

## 2021-02-28 MED ORDER — NA SULFATE-K SULFATE-MG SULF 17.5-3.13-1.6 GM/177ML PO SOLN: 1.0000 | Freq: Once | ORAL | 0 refills | Status: AC

## 2021-02-28 NOTE — [facility] Notes (Signed)
Pre visit completed via phone call; Patient verified name, DOB, and address; No egg or soy allergy known to patient  No issues known to pt with past sedation with any surgeries or procedures Patient denies ever being told they had issues or difficulty with intubation  No FH of Malignant Hyperthermia Pt is not on diet pills Pt is not on home 02  Pt is not on blood thinners  Pt denies issues with constipation at this time;  No A fib or A flutter NO PA's for preps discussed with pt in PV today  Discussed with pt there will be an out-of-pocket cost for prep and that varies from $0 to 70 + dollars - pt verbalized understanding  Due to the COVID-19 pandemic we are asking patients to follow certain guidelines in PV and the [place]   Pt aware of COVID protocols and [facility] guidelines

## 2021-03-14 ENCOUNTER — Encounter: Payer: BC Managed Care – PPO | Admitting: Gastroenterology

## 2021-08-17 ENCOUNTER — Ambulatory Visit (INDEPENDENT_AMBULATORY_CARE_PROVIDER_SITE_OTHER): Payer: BC Managed Care – PPO

## 2021-08-17 ENCOUNTER — Ambulatory Visit: Payer: BC Managed Care – PPO

## 2021-08-17 ENCOUNTER — Ambulatory Visit (INDEPENDENT_AMBULATORY_CARE_PROVIDER_SITE_OTHER): Payer: BC Managed Care – PPO | Admitting: Sports Medicine

## 2021-08-17 VITALS — BP 138/80 | HR 78 | Ht 68.0 in | Wt 184.0 lb

## 2021-08-17 DIAGNOSIS — M79671 Pain in right foot: Secondary | ICD-10-CM | POA: Diagnosis not present

## 2021-08-17 DIAGNOSIS — M7661 Achilles tendinitis, right leg: Secondary | ICD-10-CM

## 2021-08-17 MED ORDER — MELOXICAM 15 MG PO TABS: 15.0000 mg | ORAL_TABLET | Freq: Every day | ORAL | 0 refills | Status: AC

## 2021-08-17 NOTE — [facility] Notes (Signed)
? ?   David Arias ?[facility] ?[place] ?Phone: [phone] ?  ?Assessment and Plan:   ?  ?1. Right foot pain ?2. Achilles tendinitis of right lower extremity ?-Chronic with exacerbation, initial sports medicine visit ?- Consistent with Achilles tendinopathy based on significant posterior right heel pain x3 days without MOI, x-ray, physical exam with TTP at Achilles insertion ?- X-ray obtained in clinic.  My interpretation: No acute fracture or dislocation.  No stress fracture of calcaneus seen.  Small calcaneal enthesophyte at distal Achilles tendon insertion ?- Start meloxicam 15 mg daily x2 weeks.  If still having pain after 2 weeks, complete 3rd-week of meloxicam. May use remaining meloxicam as needed once daily for pain control.  Do not to use additional NSAIDs while taking meloxicam.  May use Tylenol [phone] mg 2 to 3 times a day for breakthrough pain. ?- Start HEP for Achilles tendon and gastroc ?  ?Pertinent previous records reviewed include none ?  ?Follow Up: As needed if no improvement or worsening of symptoms.  Could perform ultrasound to further evaluate Achilles tendon and insertion site ?  ?Subjective:   ?I, David Arias, am serving as a Education administrator for Doctor David Arias ? ?Chief Complaint: right heel pain  ? ?HPI:  ? ?08/17/21 ?Patient is a 66 year old male complaining of right heel pain. Patient states that it feels like he has a pointy stone in his heel, he isn't able to put any weight on , isn't able to load the ankle and put single leg weight on it, started Saturday night , no MOI, hx of gout , his taking tylenol for the pain, tried doing HEP from last visit and that made his leg swell, he took dexamethason this morning , has been using arnica gel , he states when he is at rest he is fine but when he weight bares he has pain  ? ?Relevant Historical Information: History of partial right Achilles tendon tear with calcific  tendinitis in 2015 ? ?Additional pertinent review of systems negative. ? ?No current outpatient medications on file.  ? ?Objective:   ?  ?Vitals:  ? 08/17/21 1530  ?BP: 138/80  ?Pulse: 78  ?SpO2: 98%  ?Weight: 184 lb (83.5 kg)  ?Height: '5\' 8"'$  (1.727 m)  ?  ?  ?Body mass index is 27.98 kg/m?.  ?  ?Physical Exam:   ? ?Gen: Appears well, nad, nontoxic and pleasant ?Psych: Alert and oriented, appropriate mood and affect ?Neuro: sensation intact, strength is 5/5 with df/pf/inv/ev, muscle tone wnl ?Skin: no susupicious lesions or rashes ? ?Right foot/ankle: no deformity, no swelling or effusion ?TTP distal Achilles tendon and posterior calcaneus ?NTTP over fibular head, lat mal, medial mal,  , navicular, base of 5th, ATFL, CFL, deltoid,   or midfoot ?ROM DF 15, PF 45, inv/ev intact ?Negative ant drawer, talar tilt, rotation test, squeeze test. ?Neg thompson ?No pain with resisted inversion or eversion  ?Pain with dorsiflexion ? ?Electronically signed by:  ?David Arias ?[facility] ?4:07 PM 08/17/21 ?

## 2021-08-17 NOTE — Patient Instructions (Addendum)
Good to see you ?- Start meloxicam 15 mg daily x2 weeks.  If still having pain after 2 weeks, complete 3rd-week of meloxicam. May use remaining meloxicam as needed once daily for pain control.  Do not to use additional NSAIDs while taking meloxicam.  May use Tylenol [phone] mg 2 to 3 times a day for breakthrough pain. ?Calf and Achilles HEP  ?As needed follow up ? ?

## 2021-09-13 ENCOUNTER — Other Ambulatory Visit: Payer: Self-pay | Admitting: Sports Medicine

## 2022-09-11 ENCOUNTER — Encounter (HOSPITAL_COMMUNITY): Payer: Self-pay

## 2022-09-11 ENCOUNTER — Emergency Department (HOSPITAL_COMMUNITY): Payer: BC Managed Care – PPO

## 2022-09-11 ENCOUNTER — Emergency Department (HOSPITAL_COMMUNITY)
Admission: EM | Admit: 2022-09-11 | Discharge: 2022-09-12 | Disposition: A | Discharge status: Home / Self Care | Payer: BC Managed Care – PPO | Attending: Emergency Medicine | Admitting: Emergency Medicine

## 2022-09-11 ENCOUNTER — Other Ambulatory Visit: Payer: Self-pay

## 2022-09-11 DIAGNOSIS — M79602 Pain in left arm: Secondary | ICD-10-CM | POA: Diagnosis not present

## 2022-09-11 DIAGNOSIS — R42 Dizziness and giddiness: Secondary | ICD-10-CM | POA: Diagnosis not present

## 2022-09-11 DIAGNOSIS — R079 Chest pain, unspecified: Secondary | ICD-10-CM

## 2022-09-11 DIAGNOSIS — M25512 Pain in left shoulder: Secondary | ICD-10-CM | POA: Insufficient documentation

## 2022-09-11 DIAGNOSIS — R0789 Other chest pain: Secondary | ICD-10-CM | POA: Diagnosis present

## 2022-09-11 LAB — CBC
HCT: 42.9 % (ref 39.0–52.0)
Hemoglobin: 13.9 g/dL (ref 13.0–17.0)
MCH: 27.7 pg (ref 26.0–34.0)
MCHC: 32.4 g/dL (ref 30.0–36.0)
MCV: 85.6 fL (ref 80.0–100.0)
Platelets: 357 10*3/uL (ref 150–400)
RBC: 5.01 MIL/uL (ref 4.22–5.81)
RDW: 13.3 % (ref 11.5–15.5)
WBC: 9.5 10*3/uL (ref 4.0–10.5)
nRBC: 0 % (ref 0.0–0.2)

## 2022-09-11 LAB — BASIC METABOLIC PANEL
Anion gap: 8 (ref 5–15)
BUN: 15 mg/dL (ref 8–23)
CO2: 25 mmol/L (ref 22–32)
Calcium: 9 mg/dL (ref 8.9–10.3)
Chloride: 103 mmol/L (ref 98–111)
Creatinine, Ser: 1.15 mg/dL (ref 0.61–1.24)
GFR, Estimated: >60 mL/min (ref >60)
Glucose, Bld: 102 mg/dL — ABNORMAL HIGH (ref 70–99)
Potassium: 4.3 mmol/L (ref 3.5–5.1)
Sodium: 136 mmol/L (ref 135–145)

## 2022-09-11 LAB — TROPONIN I (HIGH SENSITIVITY): Troponin I (High Sensitivity): 10 ng/L (ref <18)

## 2022-09-11 NOTE — ED Triage Notes (Signed)
Patient BIB POV with complaint of left sided neck & left arm pain, dizziness, & left sided chest pain with movement starting yesterday.   Patient reports SOB with exertion, denies nausea & vomiting.

## 2022-09-12 LAB — TROPONIN I (HIGH SENSITIVITY): Troponin I (High Sensitivity): 11 ng/L (ref <18)

## 2022-09-12 NOTE — ED Provider Notes (Signed)
EMERGENCY DEPARTMENT AT [facility] Provider Note   CSN: [id number] Arrival date & time: 09/11/22  2019     History  Chief Complaint  Patient presents with   Chest Pain    Cutberto Ricca is a 67 y.o. male.   Chest Pain Patient presents for pain to left side of chest, shoulder, arm.  Medical history includes diverticulosis, gout, IBS, HLD.  Onset of left-sided discomfort was yesterday.  Patient was at rest at time of onset.  Symptoms improved but some mild discomfort persisted throughout the day.  He did notice an episode of dizziness when standing.  Today, patient felt further improved.  He did go to work today.  He returned home at 3 PM.  He talked to his sister about his recent symptoms and she did encourage him to come to the ED for evaluation.  Currently, his symptoms have resolved.  He does not see a cardiologist.  Risk factors for ACS include HLD and positive family history.  His father had a heart attack in his late 9s.  Patient does not smoke or drink.       Home Medications Prior to Admission medications   Medication Sig Start Date End Date Taking? Authorizing Provider  meloxicam (MOBIC) 15 MG tablet Take 1 tablet (15 mg total) by mouth daily. 08/17/21   Richardean Sale, DO      Allergies    Bactrim ds [sulfamethoxazole-trimethoprim], Levaquin [levofloxacin], and Statins    Review of Systems   Review of Systems  Cardiovascular:  Positive for chest pain.  Musculoskeletal:  Positive for arthralgias and neck pain.  All other systems reviewed and are negative.   Physical Exam Updated Vital Signs BP 120/79   Pulse 66   Temp 97.9 F (36.6 C) (Oral)   Resp 12   Ht 5\' 8"  (1.727 m)   Wt 78 kg   SpO2 97%   BMI 26.15 kg/m  Physical Exam Vitals and nursing note reviewed.  Constitutional:      General: He is not in acute distress.    Appearance: He is well-developed. He is not ill-appearing, toxic-appearing or diaphoretic.  HENT:     Head:  Normocephalic and atraumatic.  Eyes:     Conjunctiva/sclera: Conjunctivae normal.  Neck:     Vascular: No JVD.  Cardiovascular:     Rate and Rhythm: Normal rate and regular rhythm.     Heart sounds: No murmur heard. Pulmonary:     Effort: Pulmonary effort is normal. No respiratory distress.     Breath sounds: Normal breath sounds.  Chest:     Chest wall: No tenderness.  Abdominal:     Palpations: Abdomen is soft.     Tenderness: There is no abdominal tenderness.  Musculoskeletal:        General: No swelling. Normal range of motion.     Cervical back: Neck supple.     Right lower leg: No edema.     Left lower leg: No edema.  Skin:    General: Skin is warm and dry.     Coloration: Skin is not cyanotic or pale.  Neurological:     General: No focal deficit present.     Mental Status: He is alert and oriented to person, place, and time.  Psychiatric:        Mood and Affect: Mood normal.        Behavior: Behavior normal.     ED Results / Procedures / Treatments   Labs (  all labs ordered are listed, but only abnormal results are displayed) Labs Reviewed  BASIC METABOLIC PANEL - Abnormal; Notable for the following components:      Result Value   Glucose, Bld 102 (*)    All other components within normal limits  CBC  TROPONIN I (HIGH SENSITIVITY)  TROPONIN I (HIGH SENSITIVITY)    EKG EKG Interpretation  Date/Time:  Monday September 11 2022 20:35:06 EDT Ventricular Rate:  74 PR Interval:  184 QRS Duration: 90 QT Interval:  372 QTC Calculation: 412 R Axis:   -10 Text Interpretation: Normal sinus rhythm Confirmed by Gloris Manchester [phone]) on 09/12/2022 12:22:58 AM  Radiology DG Chest 2 View  Result Date: 09/11/2022 CLINICAL DATA:  Chest pain EXAM: CHEST - 2 VIEW COMPARISON:  None Available. FINDINGS: The heart size and mediastinal contours are within normal limits. Both lungs are clear. The visualized skeletal structures are unremarkable. IMPRESSION: No active cardiopulmonary  disease. Electronically Signed   By: Helyn Numbers M.D.   On: 09/11/2022 21:13    Procedures Procedures    Medications Ordered in ED Medications - No data to display  ED Course/ Medical Decision Making/ A&P                             Medical Decision Making Amount and/or Complexity of Data Reviewed Labs: ordered. Radiology: ordered.   This patient presents to the ED for concern of left-sided chest, neck, and arm discomfort, this involves an extensive number of treatment options, and is a complaint that carries with it a high risk of complications and morbidity.  The differential diagnosis includes ACS, radiculopathy, muscle strain, anxiety   Co morbidities that complicate the patient evaluation  HLD, IBS, gout   Additional history obtained:  Additional history obtained from N/A External records from outside source obtained and reviewed including EMR   Lab Tests:  I Ordered, and personally interpreted labs.  The pertinent results include:  Normal hemoglobin, no leukocytosis, normal kidney function, normal electrolytes, normal troponins x 2   Imaging Studies ordered:  I ordered imaging studies including x-ray I independently visualized and interpreted imaging which showed no acute findings I agree with the radiologist interpretation   Cardiac Monitoring: / EKG:  The patient was maintained on a cardiac monitor.  I personally viewed and interpreted the cardiac monitored which showed an underlying rhythm of: Sinus rhythm   Problem List / ED Course / Critical interventions / Medication management  Patient presents for symptoms of left-sided chest, neck, and arm discomfort yesterday.  Onset was in the morning and improves throughout the day.  Symptoms further improved today.  On arrival in the ED, he is asymptomatic.  Laboratory workup, EKG, and chest x-ray were obtained prior to patient being bedded in the ED.  Results of workup are reassuring.  On exam, patient is  well-appearing.  He is asymptomatic at this time.  No cardiac rubs or murmurs are appreciated on cardiac auscultation.  Breathing is unlabored.  Patient's risk factors for ACS include HLD and positive family history.  Given his recent symptoms, patient would benefit from establishing care with cardiology.  He is agreeable to this.  Cardiology referral was ordered.  Patient was discharged stable condition.    Social Determinants of Health:  Has PCP        Final Clinical Impression(s) / ED Diagnoses Final diagnoses:  Left-sided chest pain    Rx / DC Orders ED Discharge Orders  Ordered    Ambulatory referral to Cardiology       Comments: If you have not heard from the Cardiology office within the next 72 hours please call [phone].   09/12/22 0113              Gloris Manchester, MD 09/12/22 [phone]

## 2022-09-12 NOTE — Discharge Instructions (Signed)
The testing done today in the emergency department is reassuring.  You would benefit from establishing care with cardiology.  They may recommend further testing, in light of your recent symptoms.  A cardiology referral was ordered.  If you do not hear from the cardiology office in the next couple days, call the number below to set up follow-up appointment.  Return to the emergency department for any new or worsening symptoms of concern.

## 2022-12-14 ENCOUNTER — Ambulatory Visit (INDEPENDENT_AMBULATORY_CARE_PROVIDER_SITE_OTHER): Payer: BC Managed Care – PPO | Admitting: Sports Medicine

## 2022-12-14 ENCOUNTER — Ambulatory Visit (INDEPENDENT_AMBULATORY_CARE_PROVIDER_SITE_OTHER): Payer: BC Managed Care – PPO

## 2022-12-14 VITALS — BP 110/82 | HR 110 | Ht 68.0 in | Wt 172.0 lb

## 2022-12-14 DIAGNOSIS — M79672 Pain in left foot: Secondary | ICD-10-CM

## 2022-12-14 MED ORDER — DEXAMETHASONE 4 MG PO TABS
4.0000 mg | ORAL_TABLET | Freq: Two times a day (BID) | ORAL | 1 refills | Status: DC
Start: 2022-12-14 — End: 2023-12-05

## 2022-12-14 NOTE — Patient Instructions (Addendum)
Take dexamethazon 4 mg 2x a day for 5 days Call us if not better and we will call in [employer] Work note provided As needed follow up

## 2022-12-14 NOTE — Progress Notes (Signed)
    David [facility]as D.[facility] [address] [place] [address] Phone: [phone]   Assessment and Plan:     1. Left foot pain -Acute, uncomplicated, initial sports medicine visit - Most consistent with atypical acute gout flare versus metatarsalgia based on physical exam - Start dexamethasone 4 mg twice daily for 5 days as this treatment plan has been beneficial for patient in the past.  Refill provided today - If pain does not resolve with dexamethasone course, patient to call back and we could prescribe colchicine 0.6 mg twice daily until resolution - Due to tenderness and pain with weightbearing, recommend out of work until next Monday, 12/18/2022.  Work note provided - X-ray obtained in clinic.  My interpretation: No acute fracture or dislocation.  Unremarkable imaging     Pertinent previous records reviewed include none   Follow Up: As needed.  Could consider ultrasound versus NSAID course versus physical therapy   Subjective:   I, David Arias, am serving as a Neurosurgeon for Doctor David Arias  Chief Complaint: left foot pain   HPI:   12/14/22 Patient is a 67 year old male complaining of left foot pain. Patient states that woke up with foot for a week. Pain radiates to the toes. Meloxicam did not help with the pain    2015 hx of achilles tear.     Relevant Historical Information: Gout  Additional pertinent review of systems negative.   Current Outpatient Medications:    dexamethasone (DECADRON) 4 MG tablet, Take 1 tablet (4 mg total) by mouth 2 (two) times daily with a meal., Disp: 10 tablet, Rfl: 1   meloxicam (MOBIC) 15 MG tablet, Take 1 tablet (15 mg total) by mouth daily., Disp: 30 tablet, Rfl: 0   Objective:     Vitals:   12/14/22 1025  BP: 110/82  Pulse: (!) 110  SpO2: 96%  Weight: 172 lb (78 kg)  Height: 5\' 8"  (1.727 m)      Body mass index is 26.15 kg/m.    Physical Exam:    Gen: Appears well, nad,  nontoxic and pleasant Psych: Alert and oriented, appropriate mood and affect Neuro: sensation intact, strength is 5/5 with df/pf/inv/ev, muscle tone wnl Skin: no susupicious lesions or rashes   Left foot/ankle:  No deformity, no swelling or effusion or erythema or warmth TTP dorsum of metatarsal heads, dorsal midfoot NTTP over fibular head, lat mal, medial mal, achilles, navicular, base of 5th, ATFL, CFL, deltoid, calcaneous   ROM DF 30, PF 45, inv/ev intact Negative ant drawer, talar tilt, rotation test, squeeze test. Neg thompson No pain with resisted inversion or eversion  Using crutches due to pain with ambulation  Electronically signed by:  David [facility]as D.[facility] 11:30 AM 12/14/22

## 2023-06-13 ENCOUNTER — Ambulatory Visit: Admitting: Sports Medicine

## 2023-06-13 VITALS — BP 132/86 | HR 95 | Ht 68.0 in | Wt 190.0 lb

## 2023-06-13 DIAGNOSIS — M79672 Pain in left foot: Secondary | ICD-10-CM | POA: Diagnosis not present

## 2023-06-13 DIAGNOSIS — S86012A Strain of left Achilles tendon, initial encounter: Secondary | ICD-10-CM

## 2023-06-13 MED ORDER — MELOXICAM 15 MG PO TABS: 15.0000 mg | ORAL_TABLET | Freq: Every day | ORAL | 0 refills | Status: AC

## 2023-06-13 NOTE — Progress Notes (Signed)
    David [facility] D.[facility] [address] [place] [address] Phone: [phone]   Assessment and Plan:     1. Left foot pain 2. Strain of left Achilles tendon, initial encounter -Chronic with exacerbation, subsequent visit - Recurrent left foot pain with new injury occurring 1 week ago consistent with grade 1 strain of Achilles tendon likely from new, suboptimal cycling position -Reassuring physical exam with no gross deformity, full strength, minimal tenderness, so no imaging at today's visit - Restart Achilles tendon HEP - Start meloxicam 15 mg daily x2 weeks.  If still having pain after 2 weeks, complete 3rd-week of NSAID. May use remaining NSAID as needed once daily for pain control.  Do not to use additional over-the-counter NSAIDs (ibuprofen, naproxen, Advil, Aleve) while taking prescription NSAIDs.  May use Tylenol [phone] mg 2 to 3 times a day for breakthrough pain.  Pertinent previous records reviewed include none  Follow Up: As needed if no improvement or worsening of symptoms.  Could perform ultrasound and discuss physical therapy versus medication course   Subjective:   I, [name], am serving as a Neurosurgeon for Doctor Richardean Sale   Chief Complaint: left foot pain    HPI:    12/14/22 Patient is a 68 year old male complaining of left foot pain. Patient states that woke up with foot for a week. Pain radiates to the toes. Meloxicam did not help with the pain     2015 hx of achilles tear.     06/13/2023 Patient states he thinks he injured his achilles Sunday. He was riding on the exercise bike. No meds    Relevant Historical Information: Gout  Additional pertinent review of systems negative.   Current Outpatient Medications:    dexamethasone (DECADRON) 4 MG tablet, Take 1 tablet (4 mg total) by mouth 2 (two) times daily with a meal., Disp: 10 tablet, Rfl: 1   meloxicam (MOBIC) 15 MG tablet, Take 1 tablet (15 mg  total) by mouth daily., Disp: 30 tablet, Rfl: 0   meloxicam (MOBIC) 15 MG tablet, Take 1 tablet (15 mg total) by mouth daily., Disp: 30 tablet, Rfl: 0   Objective:     Vitals:   06/13/23 1300  BP: 132/86  Pulse: 95  SpO2: 98%  Weight: 190 lb (86.2 kg)  Height: 5\' 8"  (1.727 m)      Body mass index is 28.89 kg/m.    Physical Exam:    Gen: Appears well, nad, nontoxic and pleasant Psych: Alert and oriented, appropriate mood and affect Neuro: sensation intact, strength is 5/5 with df/pf/inv/ev, muscle tone wnl Skin: no susupicious lesions or rashes  Left foot/ankle:  No deformity, no swelling or effusion Trace TTP lateral calcaneus NTTP over fibular head, lat mal, medial mal, achilles, navicular, base of 5th, ATFL, CFL, deltoid, calcaneous or midfoot ROM DF 30, PF 45, inv/ev intact Negative ant drawer, talar tilt, rotation test, squeeze test. Neg thompson No pain with resisted inversion or eversion  Minimal pain along Achilles tendon with single and double leg heel raise  Electronically signed by:  David [facility]as D.[facility] 1:26 PM 06/13/23

## 2023-06-13 NOTE — Patient Instructions (Signed)
-   Start meloxicam 15 mg daily x2 weeks.  If still having pain after 2 weeks, complete 3rd-week of NSAID. May use remaining NSAID as needed once daily for pain control.  Do not to use additional over-the-counter NSAIDs (ibuprofen, naproxen, Advil, Aleve) while taking prescription NSAIDs.  May use Tylenol [phone] mg 2 to 3 times a day for breakthrough pain.

## 2023-11-21 NOTE — Progress Notes (Unsigned)
 [facility] [address] [place] [address] Phone: [phone] Subjective:    I'm seeing this patient by the request  of:  Lazoff, Shawn P, DO  CC: Right ankle pain  YEP:Dlagzrupcz  David Arias is a 68 y.o. male coming in with complaint of R ankle pain after hiking.  Has had difficulty with weightbearing.  Past medical history significant for gout but treatment for gout has not been beneficial.  Patient states that a week ago he walked 7 miles and 2 days later he started having a gout attack in R great toe. Took gout medication which was not helpful. Pain in achilles tendon with standing. L is also starting to hurt. Also using meloxicam  for past 2 days.      Past Medical History:  Diagnosis Date   ALLERGIC RHINITIS 06/07/2007   Altered consciousness 2016   MRI/MRA normal 2016.  Normal carotid dopplers 06/24/14.  Seen by neuro and felt NOT to be TIAs.  ? Freon exposure at his work.?   ANEMIA-IRON DEFICIENCY 11/05/2007   Cataract    OU   Cervical spondylosis without myelopathy 06/07/2007   Diverticulosis    Family history of colon cancer    in 1st deg, prior to age 57 yrs.   Gout    Hyperlipidemia 12/13/2013   Intol of statins x 3 different meds; pt declines any further trials.  Zetia  trial 03/2019-(diet controlled)   Irritable bowel syndrome 02/04/2010   MYOFASCIAL PAIN SYNDROME 06/07/2007   Polymyalgia (HCC) 07/2017   Referred to rheum but pt no-showed.   Posterior vitreous detachment of left eye 01/2018   Past Surgical History:  Procedure Laterality Date   Carotid dopplers  06/24/2014   Heterogeneous plaque, bilaterally (1-39%).  Normal subclavian and vertebrals.  Rpt 2 yrs.   COLONOSCOPY  07/09/2015   Normal (Dr. Aneita): repeat 5 yrs due to strong FH CC.   TRANSTHORACIC ECHOCARDIOGRAM  06/24/2014   EF 55-60%, LVH with normal LV size.  Normal wall motion. No valvular problems.   Social History   Socioeconomic History   Marital  status: Divorced    Spouse name: Not on file   Number of children: Not on file   Years of education: Not on file   Highest education level: Not on file  Occupational History   Occupation: TECHNICIAN    Employer: [employer]    Comment: Art gallery manager  Tobacco Use   Smoking status: Never   Smokeless tobacco: Never  Vaping Use   Vaping status: Never Used  Substance and Sexual Activity   Alcohol use: Not Currently    Comment: rarely   Drug use: Never   Sexual activity: Yes    Partners: Male  Other Topics Concern   Not on file  Social History Narrative   Single, 3 sons.   Educ: some college.   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   Tobacco: none   Alc: rare wine.   Drugs: none   Social Drivers of Corporate investment banker Strain: Not on file  Food Insecurity: Low Risk  (01/16/2023)   Received from [facility]   Hunger Vital Sign    Within the past 12 months, you worried that your food would run out before you got money to buy more: Never true    Within the past 12 months, the food you bought just didn't last and you didn't have money to get more. : Never true  Transportation Needs: No Transportation Needs (  01/16/2023)   Received from [facility]   Transportation    In the past 12 months, has lack of reliable transportation kept you from medical appointments, meetings, work or from getting things needed for daily living? : No  Physical Activity: Not on file  Stress: Not on file  Social Connections: Not on file   Allergies  Allergen Reactions   Bactrim  Ds [Sulfamethoxazole -Trimethoprim ] Anaphylaxis, Hives, Shortness Of Breath and Swelling   Levaquin  [Levofloxacin ] Other (See Comments)    Tendon pain   Statins Other (See Comments)    Joint pain   Family History  Problem Relation Age of Onset   Colon polyps Mother 33   Cancer Mother 69       Colon Cancer   Colon cancer Mother 33   Heart disease Father    Heart failure Father    Hyperlipidemia Brother     Hypertension Other    Esophageal cancer Neg Hx    Rectal cancer Neg Hx    Stomach cancer Neg Hx     Current Outpatient Medications (Endocrine & Metabolic):    dexamethasone  (DECADRON ) 4 MG tablet, Take 1 tablet (4 mg total) by mouth 2 (two) times daily with a meal.   predniSONE  (DELTASONE ) 20 MG tablet, Take 1 tablet (20 mg total) by mouth daily with breakfast.    Current Outpatient Medications (Analgesics):    indomethacin  (INDOCIN ) 25 MG capsule, Take 25 mg by mouth 2 (two) times daily with a meal.   meloxicam  (MOBIC ) 15 MG tablet, Take 1 tablet (15 mg total) by mouth daily.   meloxicam  (MOBIC ) 15 MG tablet, Take 1 tablet (15 mg total) by mouth daily.   Current Outpatient Medications (Other):    Vitamin D , Ergocalciferol , (DRISDOL ) 1.25 MG (50000 UNIT) CAPS capsule, Take 1 capsule (50,000 Units total) by mouth every 7 (seven) days.    Reviewed prior external information including notes and imaging from  primary care provider As well as notes that were available from care everywhere and other healthcare systems.  Past medical history, social, surgical and family history all reviewed in electronic medical record.  No pertanent information unless stated regarding to the chief complaint.   Review of Systems:  No headache, visual changes, nausea, vomiting, diarrhea, constipation, dizziness, abdominal pain, skin rash, fevers, chills, night sweats, weight loss, swollen lymph nodes, body aches, joint swelling, chest pain, shortness of breath, mood changes. POSITIVE muscle aches  Objective  Blood pressure 110/74, pulse 99, height 5' 8 (1.727 m), weight 183 lb (83 kg), SpO2 94%.   General: No apparent distress alert and oriented x3 mood and affect normal, dressed appropriately.  HEENT: Pupils equal, extraocular movements intact  Respiratory: Patient's speak in full sentences and does not appear short of breath  Cardiovascular: No lower extremity edema, non tender, no erythema  Right  ankle exam shows patient does have some swelling noted.  Tender to palpation around the Achilles but also in the midfoot.   Limited muscular skeletal ultrasound was performed and interpreted by CLAUDENE HUSSAR, M  Limited ultrasound shows significant soft tissue hypoechoic changes consistent with swelling noted.  Patient does have a cortical irregularity noted over the 3rd and 4th metatarsals proximately.  Patient does have a likely cyst formation noted in this area but appears to be more chronic.  Some increasing in neovascularization and Doppler flow.  Patient's Achilles appears to be intact.  First MTP does have mild arthritic changes and does have to some potential gouty deposits. Impression: Questionable right  foot fracture or stress reaction   Impression and Recommendations:    The above documentation has been reviewed and is accurate and complete Dail Lerew M Temperence Zenor, DO

## 2023-11-22 ENCOUNTER — Ambulatory Visit: Admitting: Family Medicine

## 2023-11-22 ENCOUNTER — Encounter: Payer: Self-pay | Admitting: Family Medicine

## 2023-11-22 ENCOUNTER — Ambulatory Visit (INDEPENDENT_AMBULATORY_CARE_PROVIDER_SITE_OTHER)

## 2023-11-22 ENCOUNTER — Ambulatory Visit (HOSPITAL_COMMUNITY)
Admission: RE | Admit: 2023-11-22 | Discharge: 2023-11-22 | Disposition: A | Discharge status: Home / Self Care | Source: Ambulatory Visit | Attending: Family Medicine | Admitting: Family Medicine

## 2023-11-22 ENCOUNTER — Ambulatory Visit: Payer: Self-pay

## 2023-11-22 ENCOUNTER — Ambulatory Visit: Payer: Self-pay | Admitting: Family Medicine

## 2023-11-22 VITALS — BP 110/74 | HR 99 | Ht 68.0 in | Wt 183.0 lb

## 2023-11-22 DIAGNOSIS — M25571 Pain in right ankle and joints of right foot: Secondary | ICD-10-CM

## 2023-11-22 DIAGNOSIS — S92901A Unspecified fracture of right foot, initial encounter for closed fracture: Secondary | ICD-10-CM

## 2023-11-22 MED ORDER — VITAMIN D (ERGOCALCIFEROL) 1.25 MG (50000 UNIT) PO CAPS: 50000.0000 [IU] | ORAL_CAPSULE | ORAL | 0 refills | Status: DC

## 2023-11-22 MED ORDER — PREDNISONE 20 MG PO TABS: 20.0000 mg | ORAL_TABLET | Freq: Every day | ORAL | 0 refills | Status: DC

## 2023-11-22 NOTE — Patient Instructions (Addendum)
 Good to see you  Prednisone  20 mg daily for 5 days Once weekly vitamin D    [facility] [address] [place],  [place]  [address] Main: [phone]  See me again in 2 weeks

## 2023-11-22 NOTE — Assessment & Plan Note (Signed)
 Appears to be more of the 3rd or 4th metatarsal.  More stress reaction.  Patient does have a cyst noted in the area as well.  Discussed potential aspiration which patient declined.  Patient does have some soft tissue swelling that seems to be nonspecific as well.  Possible gout flare of the first MTP but seems to be resolving.  Prednisone  given, vitamin D  given, CAM Walker given, follow-up again in 2 weeks did have some mild recent travel so we will check for DVT.

## 2023-11-29 NOTE — Progress Notes (Unsigned)
 [facility] [address] [place] [address] Phone: [phone] Subjective:   David Arias, am serving as a scribe for Dr. Arthea Claudene.  I'm seeing this patient by the request  of:  Lazoff, Shawn P, DO  CC: Foot and ankle pain  YEP:Dlagzrupcz  11/22/2023 Appears to be more of the 3rd or 4th metatarsal.  More stress reaction.  Patient does have a cyst noted in the area as well.  Discussed potential aspiration which patient declined.  Patient does have some soft tissue swelling that seems to be nonspecific as well.  Possible gout flare of the first MTP but seems to be resolving.  Prednisone  given, vitamin D  given, CAM Walker given, follow-up again in 2 weeks did have some mild recent travel so we will check for DVT.     Updated 12/05/2023 David Arias is a 68 y.o. male coming in with complaint of R ankle and foot pain seem to have the 3rd and 4th metatarsal stress reactions noted.  Patient was put in a cam walker, discussed icing regimen, discussed home exercises and vitamin D  supplementation.  X-rays of patient ankle were independently visualized by me showing some calcific changes of the Achilles tendon mild thickening but seems to be of all the metatarsals. Doing a little better. Pain on the R side from hip going down the heel.      Past Medical History:  Diagnosis Date   ALLERGIC RHINITIS 06/07/2007   Altered consciousness 2016   MRI/MRA normal 2016.  Normal carotid dopplers 06/24/14.  Seen by neuro and felt NOT to be TIAs.  ? Freon exposure at his work.?   ANEMIA-IRON DEFICIENCY 11/05/2007   Cataract    OU   Cervical spondylosis without myelopathy 06/07/2007   Diverticulosis    Family history of colon cancer    in 1st deg, prior to age 43 yrs.   Gout    Hyperlipidemia 12/13/2013   Intol of statins x 3 different meds; pt declines any further trials.  Zetia  trial 03/2019-(diet controlled)   Irritable bowel syndrome 02/04/2010   MYOFASCIAL  PAIN SYNDROME 06/07/2007   Polymyalgia (HCC) 07/2017   Referred to rheum but pt no-showed.   Posterior vitreous detachment of left eye 01/2018   Past Surgical History:  Procedure Laterality Date   Carotid dopplers  06/24/2014   Heterogeneous plaque, bilaterally (1-39%).  Normal subclavian and vertebrals.  Rpt 2 yrs.   COLONOSCOPY  07/09/2015   Normal (Dr. Aneita): repeat 5 yrs due to strong FH CC.   TRANSTHORACIC ECHOCARDIOGRAM  06/24/2014   EF 55-60%, LVH with normal LV size.  Normal wall motion. No valvular problems.   Social History   Socioeconomic History   Marital status: Divorced    Spouse name: Not on file   Number of children: Not on file   Years of education: Not on file   Highest education level: Not on file  Occupational History   Occupation: TECHNICIAN    Employer: [employer]    Comment: Art gallery manager  Tobacco Use   Smoking status: Never   Smokeless tobacco: Never  Vaping Use   Vaping status: Never Used  Substance and Sexual Activity   Alcohol use: Not Currently    Comment: rarely   Drug use: Never   Sexual activity: Yes    Partners: Male  Other Topics Concern   Not on file  Social History Narrative   Single, 3 sons.   Educ: some college.  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   Tobacco: none   Alc: rare wine.   Drugs: none   Social Drivers of Corporate investment banker Strain: Not on file  Food Insecurity: Low Risk  (01/16/2023)   Received from [facility]   Hunger Vital Sign    Within the past 12 months, you worried that your food would run out before you got money to buy more: Never true    Within the past 12 months, the food you bought just didn't last and you didn't have money to get more. : Never true  Transportation Needs: No Transportation Needs (01/16/2023)   Received from [facility]    In the past 12 months, has lack of reliable transportation kept you from medical appointments, meetings, work or from getting  things needed for daily living? : No  Physical Activity: Not on file  Stress: Not on file  Social Connections: Not on file   Allergies  Allergen Reactions   Bactrim  Ds [Sulfamethoxazole -Trimethoprim ] Anaphylaxis, Hives, Shortness Of Breath and Swelling   Levaquin  [Levofloxacin ] Other (See Comments)    Tendon pain   Statins Other (See Comments)    Joint pain   Family History  Problem Relation Age of Onset   Colon polyps Mother 64   Cancer Mother 17       Colon Cancer   Colon cancer Mother 61   Heart disease Father    Heart failure Father    Hyperlipidemia Brother    Hypertension Other    Esophageal cancer Neg Hx    Rectal cancer Neg Hx    Stomach cancer Neg Hx     Current Outpatient Medications (Endocrine & Metabolic):    methylPREDNISolone  (MEDROL  DOSEPAK) 4 MG TBPK tablet, Take 6 pills first day, 5 pills next day, 4 pills day 3 , 3 pills day 4 and 2 pills day 5 then 1 pill daily until gone    Current Outpatient Medications (Analgesics):    allopurinol  (ZYLOPRIM ) 100 MG tablet, Take 2 tablets (200 mg total) by mouth daily.   indomethacin  (INDOCIN ) 25 MG capsule, Take 25 mg by mouth 2 (two) times daily with a meal.   meloxicam  (MOBIC ) 15 MG tablet, Take 1 tablet (15 mg total) by mouth daily.   meloxicam  (MOBIC ) 15 MG tablet, Take 1 tablet (15 mg total) by mouth daily.   Current Outpatient Medications (Other):    doxycycline  (VIBRA -TABS) 100 MG tablet, Take 1 tablet (100 mg total) by mouth 2 (two) times daily for 7 days.   Vitamin D , Ergocalciferol , (DRISDOL ) 1.25 MG (50000 UNIT) CAPS capsule, Take 1 capsule (50,000 Units total) by mouth every 7 (seven) days.   Reviewed prior external information including notes and imaging from  primary care provider As well as notes that were available from care everywhere and other healthcare systems.  Past medical history, social, surgical and family history all reviewed in electronic medical record.  No pertanent information unless  stated regarding to the chief complaint.   Review of Systems:  No headache, visual changes, nausea, vomiting, diarrhea, constipation, dizziness, abdominal pain, skin rash, fevers, chills, night sweats, weight loss, swollen lymph nodes, body aches, joint swelling, chest pain, shortness of breath, mood changes. POSITIVE muscle aches  Objective  Blood pressure 130/74, pulse 88, height 5' 8 (1.727 m), SpO2 98%.   General: No apparent distress alert and oriented x3 mood and affect normal, dressed appropriately.  HEENT: Pupils equal, extraocular movements intact  Respiratory: Patient's speak  in full sentences and does not appear short of breath  Cardiovascular: No lower extremity edema, non tender, no erythema  Foot exam shows patient still has swelling noted of the dorsal aspect of the foot.  Still somewhat to touch.  Mild erythema noted over the first metatarsal head.  Some limited skin.  Tender to palpation.  No pitting edema of the ankle.  Limited muscular skeletal ultrasound was performed and interpreted by CLAUDENE HUSSAR, M  Limited ultrasound does show some hypoechoic changes noted in the soft tissue.  Does seem to have some mild hypoechoic changes of the first MTP.  Does seem to have some increasing in Doppler flow consistent with inflammation.  No significant cortical irregularity noted at this point.    Impression and Recommendations:    The above documentation has been reviewed and is accurate and complete Antonin Meininger M Sylvanna Burggraf, DO

## 2023-12-05 ENCOUNTER — Ambulatory Visit: Admitting: Family Medicine

## 2023-12-05 ENCOUNTER — Other Ambulatory Visit: Payer: Self-pay

## 2023-12-05 VITALS — BP 130/74 | HR 88 | Ht 68.0 in

## 2023-12-05 DIAGNOSIS — M109 Gout, unspecified: Secondary | ICD-10-CM | POA: Diagnosis not present

## 2023-12-05 DIAGNOSIS — M25571 Pain in right ankle and joints of right foot: Secondary | ICD-10-CM | POA: Diagnosis not present

## 2023-12-05 DIAGNOSIS — S92901A Unspecified fracture of right foot, initial encounter for closed fracture: Secondary | ICD-10-CM | POA: Diagnosis not present

## 2023-12-05 MED ORDER — ALLOPURINOL 100 MG PO TABS
200.0000 mg | ORAL_TABLET | Freq: Every day | ORAL | 3 refills | Status: DC
Start: 2023-12-05 — End: 2024-01-04

## 2023-12-05 MED ORDER — DOXYCYCLINE HYCLATE 100 MG PO TABS: 100.0000 mg | ORAL_TABLET | Freq: Two times a day (BID) | ORAL | 0 refills | Status: AC

## 2023-12-05 MED ORDER — METHYLPREDNISOLONE 4 MG PO TBPK
ORAL_TABLET | ORAL | 0 refills | Status: DC
Start: 2023-12-05 — End: 2024-01-25

## 2023-12-05 NOTE — Assessment & Plan Note (Signed)
 Unfortunately I do believe the patient does have gouty arthritis noted.  We do believe that at this point it seems to be more of the gout causing more of the discomfort and pain.  Will start patient on allopurinol .  Discussed increasing the tart cherry.  Will bridge with a Medrol  Dosepak.  Once swelling goes down can change back to a regular shoe.  Have ruled out any blood clot.  Will treat for the other possibility of an infectious etiology which I think is low likelihood follow-up again in 4 weeks

## 2023-12-05 NOTE — Patient Instructions (Addendum)
 Good to see you. Doxycyline 100 mg twice a day for 10 days. Dose pack. Follow instructions on package. Allopurinol  200 mg daily. If you feel better, then you can transition to shoe. See me again in 4 weeks. Will check labs again then.

## 2023-12-06 ENCOUNTER — Encounter: Payer: Self-pay | Admitting: Family Medicine

## 2023-12-06 NOTE — Assessment & Plan Note (Signed)
 I believe that this was misinterpreted now looking at what we were seeing.  I think at this point it was more secondary to a gouty flare.  Started allopurinol  200 mg.  Short course of a Medrol  Dosepak as well.  See how patient responds.  Also cover for potential infectious etiology

## 2023-12-28 ENCOUNTER — Encounter (HOSPITAL_COMMUNITY): Payer: Self-pay

## 2023-12-28 ENCOUNTER — Emergency Department (HOSPITAL_COMMUNITY)
Admission: EM | Admit: 2023-12-28 | Discharge: 2023-12-29 | Disposition: A | Discharge status: Home / Self Care | Attending: Emergency Medicine | Admitting: Emergency Medicine

## 2023-12-28 ENCOUNTER — Emergency Department (HOSPITAL_COMMUNITY)

## 2023-12-28 DIAGNOSIS — R109 Unspecified abdominal pain: Secondary | ICD-10-CM | POA: Insufficient documentation

## 2023-12-28 DIAGNOSIS — R748 Abnormal levels of other serum enzymes: Secondary | ICD-10-CM | POA: Insufficient documentation

## 2023-12-28 DIAGNOSIS — M791 Myalgia, unspecified site: Secondary | ICD-10-CM | POA: Insufficient documentation

## 2023-12-28 DIAGNOSIS — D72829 Elevated white blood cell count, unspecified: Secondary | ICD-10-CM | POA: Insufficient documentation

## 2023-12-28 DIAGNOSIS — R509 Fever, unspecified: Secondary | ICD-10-CM | POA: Insufficient documentation

## 2023-12-28 DIAGNOSIS — M25572 Pain in left ankle and joints of left foot: Secondary | ICD-10-CM | POA: Diagnosis not present

## 2023-12-28 DIAGNOSIS — M25571 Pain in right ankle and joints of right foot: Secondary | ICD-10-CM | POA: Insufficient documentation

## 2023-12-28 LAB — COMPREHENSIVE METABOLIC PANEL WITH GFR
ALT: 30 U/L (ref 0–44)
AST: 30 U/L (ref 15–41)
Albumin: 3.9 g/dL (ref 3.5–5.0)
Alkaline Phosphatase: 130 U/L — ABNORMAL HIGH (ref 38–126)
Anion gap: 11 (ref 5–15)
BUN: 16 mg/dL (ref 8–23)
CO2: 23 mmol/L (ref 22–32)
Calcium: 9.9 mg/dL (ref 8.9–10.3)
Chloride: 101 mmol/L (ref 98–111)
Creatinine, Ser: 1.02 mg/dL (ref 0.61–1.24)
GFR, Estimated: >60 mL/min (ref >60)
Glucose, Bld: 132 mg/dL — ABNORMAL HIGH (ref 70–99)
Potassium: 4.4 mmol/L (ref 3.5–5.1)
Sodium: 136 mmol/L (ref 135–145)
Total Bilirubin: 0.5 mg/dL (ref 0.0–1.2)
Total Protein: 7.4 g/dL (ref 6.5–8.1)

## 2023-12-28 LAB — CBC WITH DIFFERENTIAL/PLATELET
Abs Immature Granulocytes: 0.07 K/uL (ref 0.00–0.07)
Basophils Absolute: 0.1 K/uL (ref 0.0–0.1)
Basophils Relative: 0 %
Eosinophils Absolute: 0.1 K/uL (ref 0.0–0.5)
Eosinophils Relative: 0 %
HCT: 41.9 % (ref 39.0–52.0)
Hemoglobin: 13.1 g/dL (ref 13.0–17.0)
Immature Granulocytes: 0 %
Lymphocytes Relative: 13 %
Lymphs Abs: 2.1 K/uL (ref 0.7–4.0)
MCH: 26.8 pg (ref 26.0–34.0)
MCHC: 31.3 g/dL (ref 30.0–36.0)
MCV: 85.9 fL (ref 80.0–100.0)
Monocytes Absolute: 1.4 K/uL — ABNORMAL HIGH (ref 0.1–1.0)
Monocytes Relative: 9 %
Neutro Abs: 12.1 K/uL — ABNORMAL HIGH (ref 1.7–7.7)
Neutrophils Relative %: 78 %
Platelets: 397 K/uL (ref 150–400)
RBC: 4.88 MIL/uL (ref 4.22–5.81)
RDW: 13.7 % (ref 11.5–15.5)
WBC: 15.8 K/uL — ABNORMAL HIGH (ref 4.0–10.5)
nRBC: 0 % (ref 0.0–0.2)

## 2023-12-28 LAB — I-STAT CG4 LACTIC ACID, ED: Lactic Acid, Venous: 1 mmol/L (ref 0.5–1.9)

## 2023-12-28 NOTE — ED Provider Notes (Incomplete)
 [place] EMERGENCY DEPARTMENT AT [facility] Provider Note   CSN: [id number] Arrival date & time: 12/28/23  2140     Patient presents with: Fever and Ankle Pain   David Arias is a 68 y.o. male with history of iron deficiency anemia, gout, myofascial pain syndrome, hyperlipidemia, IBS, polymyalgia rheumatica.  Patient presents to ED for evaluation of bilateral ankle pain and a fever.  Reports that for the last month or so he has been dealing with his gout flares which typically occur in his right great MTP.  States that a few days ago after going on a very extensive and arduous hike he began to have pain in his bilateral ankles.  He reports that he has had this pain ever since onset, he has to take 2 Aleve in order to be able to walk.  He reports that he has been dealing with gout recently but he does not feel as if his symptoms today are because of gout.  He denies any trauma or injury to his ankles.  He is also complaining of fever at home and reports that a month ago he was seen for abdominal pain and had CT scan collected which showed that he had a inflamed bladder and he was started on doxycycline  for suspected UTI.  He reports that he has been taking steroids at home which were given to him by his PCP.  States he last took steroids on the 11th of this month.  He states that at 1 point today he has some dysuria but none since then.  He reports at 1 point today he had a fever of 101.3 at home and took ibuprofen.  He denies sore throat, bodyaches or chills.  Denies abdominal pain, nausea or vomiting.   Fever Ankle Pain Associated symptoms: fever        Prior to Admission medications   Medication Sig Start Date End Date Taking? Authorizing Provider  allopurinol  (ZYLOPRIM ) 100 MG tablet Take 2 tablets (200 mg total) by mouth daily. 12/05/23   Smith, Zachary M, DO  indomethacin  (INDOCIN ) 25 MG capsule Take 25 mg by mouth 2 (two) times daily with a meal.    [provider]   meloxicam  (MOBIC ) 15 MG tablet Take 1 tablet (15 mg total) by mouth daily. 08/17/21   Leonce Katz, DO  meloxicam  (MOBIC ) 15 MG tablet Take 1 tablet (15 mg total) by mouth daily. 06/13/23   Leonce Katz, DO  methylPREDNISolone  (MEDROL  DOSEPAK) 4 MG TBPK tablet Take 6 pills first day, 5 pills next day, 4 pills day 3 , 3 pills day 4 and 2 pills day 5 then 1 pill daily until gone 12/05/23   Smith, Zachary M, DO  Vitamin D , Ergocalciferol , (DRISDOL ) 1.25 MG (50000 UNIT) CAPS capsule Take 1 capsule (50,000 Units total) by mouth every 7 (seven) days. 11/22/23   Claudene Arthea HERO, DO    Allergies: Bactrim  ds [sulfamethoxazole -trimethoprim ], Levaquin  [levofloxacin ], and Statins    Review of Systems  Constitutional:  Positive for fever.    Updated Vital Signs BP (!) 154/84   Pulse (!) 103   Temp 98.7 F (37.1 C) (Oral)   Resp 18   SpO2 98%   Physical Exam  (all labs ordered are listed, but only abnormal results are displayed) Labs Reviewed  CBC WITH DIFFERENTIAL/PLATELET - Abnormal; Notable for the following components:      Result Value   WBC 15.8 (*)    Neutro Abs 12.1 (*)    Monocytes  Absolute 1.4 (*)    All other components within normal limits  RESP PANEL BY RT-PCR (RSV, FLU A&B, COVID)  RVPGX2  COMPREHENSIVE METABOLIC PANEL WITH GFR  URINALYSIS, W/ REFLEX TO CULTURE (INFECTION SUSPECTED)  I-STAT CG4 LACTIC ACID, ED    EKG: None  Radiology: No results found.  {Document cardiac monitor, telemetry assessment procedure when appropriate:32947} Procedures   Medications Ordered in the ED - No data to display    {Click here for ABCD2, HEART and other calculators REFRESH Note before signing:1}                              Medical Decision Making Amount and/or Complexity of Data Reviewed Labs: ordered. Radiology: ordered.   ***  {Document critical care time when appropriate  Document review of labs and clinical decision tools ie CHADS2VASC2, etc  Document your  independent review of radiology images and any outside records  Document your discussion with family members, caretakers and with consultants  Document social determinants of health affecting pt's care  Document your decision making why or why not admission, treatments were needed:32947:::1}   Final diagnoses:  None    ED Discharge Orders     None

## 2023-12-28 NOTE — ED Provider Notes (Signed)
 [place] EMERGENCY DEPARTMENT AT [facility] Provider Note   CSN: [id number] Arrival date & time: 12/28/23  2140     Patient presents with: Fever and Ankle Pain   David Arias is a 68 y.o. male with history of iron deficiency anemia, gout, myofascial pain syndrome, hyperlipidemia, IBS, polymyalgia rheumatica.  Patient presents to ED for evaluation of bilateral ankle pain and a fever.  Reports that for the last month or so he has been dealing with his gout flares which typically occur in his right great MTP.  States that a few days ago after going on a very extensive and arduous hike he began to have pain in his bilateral ankles.  He reports that he has had this pain ever since onset, he has to take 2 Aleve in order to be able to walk.  He reports that he has been dealing with gout recently but he does not feel as if his symptoms today are because of gout.  He denies any trauma or injury to his ankles.  He is also complaining of fever at home and reports that a month ago he was seen for abdominal pain and had CT scan collected which showed that he had a inflamed bladder and he was started on doxycycline  for suspected UTI.  He reports that he has been taking steroids at home which were given to him by his PCP.  States he last took steroids on the 11th of this month.  He states that at 1 point today he has some dysuria but none since then.  He reports at 1 point today he had a fever of 101.3 at home and took ibuprofen.  He denies sore throat, bodyaches or chills.  Denies abdominal pain, nausea or vomiting.   Fever Associated symptoms: myalgias   Ankle Pain Associated symptoms: fever        Prior to Admission medications   Medication Sig Start Date End Date Taking? Authorizing Provider  diclofenac Sodium (VOLTAREN) 1 % GEL Apply 2 g topically 4 (four) times daily. 12/29/23  Yes Ruthell Lonni FALCON, PA-C  allopurinol  (ZYLOPRIM ) 100 MG tablet Take 2 tablets (200 mg total) by mouth  daily. 12/05/23   Claudene Arthea HERO, DO  indomethacin  (INDOCIN ) 25 MG capsule Take 25 mg by mouth 2 (two) times daily with a meal.    [provider]  meloxicam  (MOBIC ) 15 MG tablet Take 1 tablet (15 mg total) by mouth daily. 08/17/21   Leonce Katz, DO  meloxicam  (MOBIC ) 15 MG tablet Take 1 tablet (15 mg total) by mouth daily. 06/13/23   Leonce Katz, DO  methylPREDNISolone  (MEDROL  DOSEPAK) 4 MG TBPK tablet Take 6 pills first day, 5 pills next day, 4 pills day 3 , 3 pills day 4 and 2 pills day 5 then 1 pill daily until gone 12/05/23   Claudene Arthea HERO, DO  Vitamin D , Ergocalciferol , (DRISDOL ) 1.25 MG (50000 UNIT) CAPS capsule Take 1 capsule (50,000 Units total) by mouth every 7 (seven) days. 11/22/23   Claudene Arthea HERO, DO    Allergies: Bactrim  ds [sulfamethoxazole -trimethoprim ], Levaquin  [levofloxacin ], and Statins    Review of Systems  Constitutional:  Positive for fever.  Musculoskeletal:  Positive for arthralgias and myalgias.  All other systems reviewed and are negative.   Updated Vital Signs BP (!) 154/84   Pulse 87   Temp 98.7 F (37.1 C) (Oral)   Resp 18   SpO2 96%   Physical Exam Vitals and nursing note reviewed.  Constitutional:  General: He is not in acute distress.    Appearance: He is well-developed.  HENT:     Head: Normocephalic and atraumatic.  Eyes:     Conjunctiva/sclera: Conjunctivae normal.  Cardiovascular:     Rate and Rhythm: Normal rate and regular rhythm.     Heart sounds: No murmur heard. Pulmonary:     Effort: Pulmonary effort is normal. No respiratory distress.     Breath sounds: Normal breath sounds.  Abdominal:     Palpations: Abdomen is soft.     Tenderness: There is no abdominal tenderness.  Musculoskeletal:        General: No swelling.     Cervical back: Neck supple.     Comments: No erythema, warmth, obvious swelling to patient bilateral ankles.  Full ROM appreciated.  2+ DP pulse in the bilateral lower extremities.   Swelling to the right MTP.  Skin:    General: Skin is warm and dry.     Capillary Refill: Capillary refill takes less than 2 seconds.  Neurological:     Mental Status: He is alert and oriented to person, place, and time. Mental status is at baseline.  Psychiatric:        Mood and Affect: Mood normal.     (all labs ordered are listed, but only abnormal results are displayed) Labs Reviewed  COMPREHENSIVE METABOLIC PANEL WITH GFR - Abnormal; Notable for the following components:      Result Value   Glucose, Bld 132 (*)    Alkaline Phosphatase 130 (*)    All other components within normal limits  CBC WITH DIFFERENTIAL/PLATELET - Abnormal; Notable for the following components:   WBC 15.8 (*)    Neutro Abs 12.1 (*)    Monocytes Absolute 1.4 (*)    All other components within normal limits  CK - Abnormal; Notable for the following components:   Total CK 39 (*)    All other components within normal limits  RESP PANEL BY RT-PCR (RSV, FLU A&B, COVID)  RVPGX2  URINALYSIS, W/ REFLEX TO CULTURE (INFECTION SUSPECTED)  I-STAT CG4 LACTIC ACID, ED    EKG: None  Radiology: DG Ankle Complete Left Result Date: 12/28/2023 CLINICAL DATA:  Bilateral ankle pain and swelling. History of gout. Fever. EXAM: LEFT ANKLE COMPLETE - 3+ VIEW COMPARISON:  12/14/2022 FINDINGS: Soft tissue swelling about the left ankle, greatest laterally. No evidence of acute fracture or dislocation. No focal bone lesion or bone destruction. No periarticular erosions. Joint spaces are normal. Small Achilles calcaneal spur. No radiopaque soft tissue foreign bodies. IMPRESSION: Mild soft tissue swelling. No acute bony abnormalities. No rows of arthritic changes. Electronically Signed   By: Elsie Gravely M.D.   On: 12/28/2023 23:13   DG Ankle Complete Right Result Date: 12/28/2023 CLINICAL DATA:  Pain and swelling to both ankles with history of gout. Fever. EXAM: RIGHT ANKLE - COMPLETE 3+ VIEW COMPARISON:  11/22/2023 FINDINGS:  Soft tissue swelling about the right ankle greatest laterally. No evidence of acute fracture or dislocation. No focal bone lesion or bone destruction. No periarticular erosions. Joint spaces are normal. No radiopaque soft tissue foreign bodies. IMPRESSION: Soft tissue swelling about the right ankle. No acute bony abnormalities. No erosive arthritic changes. Electronically Signed   By: Elsie Gravely M.D.   On: 12/28/2023 23:12    Procedures   Medications Ordered in the ED - No data to display  Medical Decision Making Amount and/or Complexity of Data Reviewed Labs: ordered. Radiology: ordered.   This is  a 68 year old male presenting to the ED out of concern of bilateral ankle pain, fever.  On exam, the patient is afebrile and nontachycardic.  Lung sounds are clear bilaterally, no hypoxia.  Abdomen soft and compressible.  Neurological examination at baseline.  Bilateral ankles without erythema, warmth, obvious swelling.  Full ROM appreciated.  2+ DP pulse in the bilateral feet.  Patient labwork initiated in triage include CBC, CMP, lactic acid, viral panel, urinalysis.  I have added on plain, imaging of bilateral ankles.  Also added on CK at patient request.  Patient CBC here with white count elevation of 15.8 in the setting of recently finishing steroids, no anemia.  Metabolic panel with alk phos elevation 130, no other elevated LFT, bilirubin 0.5.  CK 39.  Urinalysis negative for all.  Lactic acid not elevated at 1.  Viral panel pending.  Plain film imaging of bilateral ankles without signs of soft tissue swelling or injury.  At this time, suspect patient ankle pain could be secondary to recently hiking.  He reports that 1 or 2 days prior to onset of this pain he did do an extensive amount of hiking.  His white count elevation is most likely due to the fact that he recently finished steroids.  I do not feel as if the patient is having a gout flare to his bilateral ankles.  At this time  patient will be discharged advised to continue take allopurinol .  I will also send him with Voltaren gel.  Advised to follow-up with his PCP.  Patient advised to follow-up with PCP.  Stable to discharge home.   Final diagnoses:  Acute bilateral ankle pain    ED Discharge Orders          Ordered    diclofenac Sodium (VOLTAREN) 1 % GEL  4 times daily        12/29/23 0138               Ruthell Lonni FALCON, PA-C 12/29/23 0139    Trine Raynell Moder, MD 12/29/23 [phone]

## 2023-12-28 NOTE — ED Triage Notes (Signed)
 Pt has c/o bilateral foot swelling, bilateral ankle pain, fever of 100.4 at home. Pt took Aleve at 1800.

## 2023-12-29 LAB — URINALYSIS, W/ REFLEX TO CULTURE (INFECTION SUSPECTED)
Bacteria, UA: NONE SEEN
Bilirubin Urine: NEGATIVE
Glucose, UA: NEGATIVE mg/dL
Hgb urine dipstick: NEGATIVE
Ketones, ur: NEGATIVE mg/dL
Leukocytes,Ua: NEGATIVE
Nitrite: NEGATIVE
Protein, ur: NEGATIVE mg/dL
Specific Gravity, Urine: 1.027 (ref 1.005–1.030)
pH: 5 (ref 5.0–8.0)

## 2023-12-29 LAB — CK: Total CK: 39 U/L — ABNORMAL LOW (ref 49–397)

## 2023-12-29 LAB — RESP PANEL BY RT-PCR (RSV, FLU A&B, COVID)  RVPGX2
Influenza A by PCR: NEGATIVE
Influenza B by PCR: NEGATIVE
Resp Syncytial Virus by PCR: NEGATIVE
SARS Coronavirus 2 by RT PCR: NEGATIVE

## 2023-12-29 MED ORDER — ALLOPURINOL 100 MG PO TABS: 100.0000 mg | ORAL_TABLET | ORAL | Status: DC

## 2023-12-29 MED ORDER — PREDNISONE 20 MG PO TABS
60.0000 mg | ORAL_TABLET | Freq: Once | ORAL | Status: DC
  Filled 2023-12-29: qty 3

## 2023-12-29 MED ORDER — DICLOFENAC SODIUM 1 % EX GEL: 2.0000 g | Freq: Four times a day (QID) | CUTANEOUS | 0 refills | Status: AC

## 2023-12-29 NOTE — ED Notes (Signed)
 Patient d/c with home care instructions. VS obtained. IV discontinued.

## 2023-12-29 NOTE — Discharge Instructions (Signed)
 It was a pleasure taking part in your care.  As we discussed, I believe your pain in bilateral ankles could be secondary to the extensive amount of hiking you recently did.  Please continue taking allopurinol  at home.  Please utilize Voltaren gel as prescribed.  Please follow-up with your PCP for further care.  Return to the ED with new symptoms.

## 2024-01-03 NOTE — Progress Notes (Unsigned)
 [facility] [address] [place] [address] Phone: [phone] Subjective:   [name], am serving as a scribe for Dr. Arthea Claudene.  I'm seeing this patient by the request  of:  Lazoff, Shawn P, DO  CC: bilateral ankle   YEP:Dlagzrupcz  12/05/2023 Unfortunately I do believe the patient does have gouty arthritis noted.  We do believe that at this point it seems to be more of the gout causing more of the discomfort and pain.  Will start patient on allopurinol .  Discussed increasing the tart cherry.  Will bridge with a Medrol  Dosepak.  Once swelling goes down can change back to a regular shoe.  Have ruled out any blood clot.  Will treat for the other possibility of an infectious etiology which I think is low likelihood follow-up again in 4 weeks     Update 01/04/2024 David Arias is a 68 y.o. male coming in with complaint of B ankle pain. Went to ED on 12/28/2023 with increase in ankle pain. Soft tissue swelling noted in B ankle xray. Patient states that his pain over lateral malleolus and into the calf in both feet. L foot is swollen causing antalgic gait. Patient also feels like he is retaining fluid and that is why he went into the ED.        Past Medical History:  Diagnosis Date   ALLERGIC RHINITIS 06/07/2007   Altered consciousness 2016   MRI/MRA normal 2016.  Normal carotid dopplers 06/24/14.  Seen by neuro and felt NOT to be TIAs.  ? Freon exposure at his work.?   ANEMIA-IRON DEFICIENCY 11/05/2007   Cataract    OU   Cervical spondylosis without myelopathy 06/07/2007   Diverticulosis    Family history of colon cancer    in 1st deg, prior to age 55 yrs.   Gout    Hyperlipidemia 12/13/2013   Intol of statins x 3 different meds; pt declines any further trials.  Zetia  trial 03/2019-(diet controlled)   Irritable bowel syndrome 02/04/2010   MYOFASCIAL PAIN SYNDROME 06/07/2007   Polymyalgia 07/2017   Referred to rheum but pt no-showed.    Posterior vitreous detachment of left eye 01/2018   Past Surgical History:  Procedure Laterality Date   Carotid dopplers  06/24/2014   Heterogeneous plaque, bilaterally (1-39%).  Normal subclavian and vertebrals.  Rpt 2 yrs.   COLONOSCOPY  07/09/2015   Normal (Dr. Aneita): repeat 5 yrs due to strong FH CC.   TRANSTHORACIC ECHOCARDIOGRAM  06/24/2014   EF 55-60%, LVH with normal LV size.  Normal wall motion. No valvular problems.   Social History   Socioeconomic History   Marital status: Divorced    Spouse name: Not on file   Number of children: Not on file   Years of education: Not on file   Highest education level: Not on file  Occupational History   Occupation: TECHNICIAN    Employer: [employer]    Comment: Art gallery manager  Tobacco Use   Smoking status: Never   Smokeless tobacco: Never  Vaping Use   Vaping status: Never Used  Substance and Sexual Activity   Alcohol use: Not Currently    Comment: rarely   Drug use: Never   Sexual activity: Yes    Partners: Male  Other Topics Concern   Not on file  Social History Narrative   Single, 3 sons.   Educ: some college.   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   Tobacco: none  Alc: rare wine.   Drugs: none   Social Drivers of Corporate investment banker Strain: Not on file  Food Insecurity: Low Risk  (01/16/2023)   Received from [facility]   Hunger Vital Sign    Within the past 12 months, you worried that your food would run out before you got money to buy more: Never true    Within the past 12 months, the food you bought just didn't last and you didn't have money to get more. : Never true  Transportation Needs: No Transportation Needs (01/16/2023)   Received from [facility]    In the past 12 months, has lack of reliable transportation kept you from medical appointments, meetings, work or from getting things needed for daily living? : No  Physical Activity: Not on file  Stress: Not on file   Social Connections: Not on file   Allergies  Allergen Reactions   Bactrim  Ds [Sulfamethoxazole -Trimethoprim ] Anaphylaxis, Hives, Shortness Of Breath and Swelling   Levaquin  [Levofloxacin ] Other (See Comments)    Tendon pain   Statins Other (See Comments)    Joint pain   Family History  Problem Relation Age of Onset   Colon polyps Mother 18   Cancer Mother 25       Colon Cancer   Colon cancer Mother 48   Heart disease Father    Heart failure Father    Hyperlipidemia Brother    Hypertension Other    Esophageal cancer Neg Hx    Rectal cancer Neg Hx    Stomach cancer Neg Hx     Current Outpatient Medications (Endocrine & Metabolic):    methylPREDNISolone  (MEDROL  DOSEPAK) 4 MG TBPK tablet, Take 6 pills first day, 5 pills next day, 4 pills day 3 , 3 pills day 4 and 2 pills day 5 then 1 pill daily until gone       Current Outpatient Medications (Analgesics):    allopurinol  (ZYLOPRIM ) 300 MG tablet, Take 1 tablet (300 mg total) by mouth daily.   indomethacin  (INDOCIN ) 25 MG capsule, Take 25 mg by mouth 2 (two) times daily with a meal.   meloxicam  (MOBIC ) 15 MG tablet, Take 1 tablet (15 mg total) by mouth daily.   meloxicam  (MOBIC ) 15 MG tablet, Take 1 tablet (15 mg total) by mouth daily.     Current Outpatient Medications (Other):    diclofenac Sodium (VOLTAREN) 1 % GEL, Apply 2 g topically 4 (four) times daily.   Vitamin D , Ergocalciferol , (DRISDOL ) 1.25 MG (50000 UNIT) CAPS capsule, Take 1 capsule (50,000 Units total) by mouth every 7 (seven) days.   Facility-Administered Medications Ordered in Other Visits (Other):    iopamidol (ISOVUE-300) 61 % injection 100 mL No current facility-administered medications for this visit.   Reviewed prior external information including notes and imaging from  primary care provider As well as notes that were available from care everywhere and other healthcare systems.  Past medical history, social, surgical and family history all  reviewed in electronic medical record.  No pertanent information unless stated regarding to the chief complaint.   Review of Systems:  No headache, visual changes, nausea, vomiting, diarrhea, constipation, dizziness, abdominal pain, skin rash, fevers, chills, night sweats, weight loss, swollen lymph nodes, body aches, joint swelling, chest pain, shortness of breath, mood changes. POSITIVE muscle aches  Objective  Blood pressure 134/88, pulse 84, height 5' 8 (1.727 m), weight 186 lb (84.4 kg), SpO2 98%.   General: No apparent distress alert and  oriented x3 mood and affect normal, dressed appropriately.  HEENT: Pupils equal, extraocular movements intact  Respiratory: Patient's speak in full sentences and does not appear short of breath  Cardiovascular: No lower extremity edema, non tender, no erythema  Bilateral foot exam shows some swelling noted.  Erythema noted of the first toe.  Warmness to touch but noes type of cellulitic changes noted.  Limited muscular skeletal ultrasound was performed and interpreted by CLAUDENE HUSSAR, M  Limited ultrasound shows that patient does have hypoechoic changes coming from the first MTP as well as some surrounding soft tissue and inflammation consistent with gout flare.  No sign of any infectious etiology.    Impression and Recommendations:     The above documentation has been reviewed and is accurate and complete Lindaann Gradilla M Yamili Lichtenwalner, DO

## 2024-01-04 ENCOUNTER — Ambulatory Visit
Admission: RE | Admit: 2024-01-04 | Discharge: 2024-01-04 | Disposition: A | Discharge status: Home / Self Care | Source: Ambulatory Visit | Attending: Family Medicine | Admitting: Family Medicine

## 2024-01-04 ENCOUNTER — Ambulatory Visit (INDEPENDENT_AMBULATORY_CARE_PROVIDER_SITE_OTHER): Admitting: Family Medicine

## 2024-01-04 ENCOUNTER — Encounter: Payer: Self-pay | Admitting: Family Medicine

## 2024-01-04 ENCOUNTER — Ambulatory Visit: Payer: Self-pay

## 2024-01-04 ENCOUNTER — Ambulatory Visit: Payer: Self-pay | Admitting: Family Medicine

## 2024-01-04 VITALS — BP 134/88 | HR 84 | Ht 68.0 in | Wt 186.0 lb

## 2024-01-04 DIAGNOSIS — M109 Gout, unspecified: Secondary | ICD-10-CM

## 2024-01-04 DIAGNOSIS — D72829 Elevated white blood cell count, unspecified: Secondary | ICD-10-CM

## 2024-01-04 DIAGNOSIS — E790 Hyperuricemia without signs of inflammatory arthritis and tophaceous disease: Secondary | ICD-10-CM

## 2024-01-04 DIAGNOSIS — M25571 Pain in right ankle and joints of right foot: Secondary | ICD-10-CM

## 2024-01-04 MED ORDER — METHYLPREDNISOLONE ACETATE 80 MG/ML IJ SUSP
80.0000 mg | Freq: Once | INTRAMUSCULAR | Status: AC
  Administered 2024-01-04: 80 mg via INTRAMUSCULAR

## 2024-01-04 MED ORDER — KETOROLAC TROMETHAMINE 60 MG/2ML IM SOLN
60.0000 mg | Freq: Once | INTRAMUSCULAR | Status: AC
  Administered 2024-01-04: 60 mg via INTRAMUSCULAR

## 2024-01-04 MED ORDER — IOPAMIDOL (ISOVUE-300) INJECTION 61%
100.0000 mL | Freq: Once | INTRAVENOUS | Status: AC | PRN
  Administered 2024-01-04: 100 mL via INTRAVENOUS

## 2024-01-04 MED ORDER — ALLOPURINOL 300 MG PO TABS: 300.0000 mg | ORAL_TABLET | Freq: Every day | ORAL | 0 refills | Status: DC

## 2024-01-04 NOTE — Assessment & Plan Note (Signed)
 Unknown reason for leukocytosis.  Has not been on prednisone  for 3 weeks.  Concern for prostatitis and history of kidney stones.  CT abdomen pelvis with contrast to further evaluate.  Depending on findings we will discuss management.  Toradol and Depo-Medrol  injections given today to help with some pain.

## 2024-01-04 NOTE — Assessment & Plan Note (Signed)
 Known gouty arthritis but unfortunately secondary to patient having increasing discomfort and pain I do feel that there is an infectious etiology somewhere.  Significant elevation in white blood cell count and patient has been off steroids for greater than 3 weeks.  Very concerned that there may be some other underlying condition that is contributing to this.  I am concerned the patient may have more of a prostatitis.  Do feel CT abdomen and pelvis with contrast is necessary at this time for further evaluation with the significant elevation of uric acid that is not being controlled with allopurinol , tart cherry extract and once again with a unknown reason for elevation in white blood cell count.  Depending on findings we will discuss management thereafter.

## 2024-01-04 NOTE — Patient Instructions (Addendum)
 CT abdomen and pelvis W  We will be in touch Allopurinol  300mg  daily Skip Aleve today Full cocktail in buttocks today.

## 2024-01-08 ENCOUNTER — Other Ambulatory Visit: Payer: Self-pay

## 2024-01-08 DIAGNOSIS — R5383 Other fatigue: Secondary | ICD-10-CM

## 2024-01-08 DIAGNOSIS — R1031 Right lower quadrant pain: Secondary | ICD-10-CM

## 2024-01-11 ENCOUNTER — Other Ambulatory Visit (INDEPENDENT_AMBULATORY_CARE_PROVIDER_SITE_OTHER)

## 2024-01-11 DIAGNOSIS — R5383 Other fatigue: Secondary | ICD-10-CM | POA: Diagnosis not present

## 2024-01-11 LAB — PSA: PSA: 1.06 ng/mL (ref 0.10–4.00)

## 2024-01-13 ENCOUNTER — Ambulatory Visit: Payer: Self-pay | Admitting: Family Medicine

## 2024-01-22 ENCOUNTER — Ambulatory Visit
Admission: RE | Admit: 2024-01-22 | Discharge: 2024-01-22 | Disposition: A | Discharge status: Home / Self Care | Source: Ambulatory Visit | Attending: Family Medicine | Admitting: Family Medicine

## 2024-01-22 DIAGNOSIS — R1031 Right lower quadrant pain: Secondary | ICD-10-CM

## 2024-01-22 MED ORDER — GADOPICLENOL 0.5 MMOL/ML IV SOLN
8.5000 mL | Freq: Once | INTRAVENOUS | Status: AC | PRN
  Administered 2024-01-22: 8.5 mL via INTRAVENOUS

## 2024-01-25 ENCOUNTER — Emergency Department (HOSPITAL_BASED_OUTPATIENT_CLINIC_OR_DEPARTMENT_OTHER)

## 2024-01-25 ENCOUNTER — Emergency Department (HOSPITAL_BASED_OUTPATIENT_CLINIC_OR_DEPARTMENT_OTHER)
Admission: EM | Admit: 2024-01-25 | Discharge: 2024-01-25 | Disposition: A | Discharge status: Home / Self Care | Source: Ambulatory Visit | Attending: Emergency Medicine | Admitting: Emergency Medicine

## 2024-01-25 ENCOUNTER — Other Ambulatory Visit: Payer: Self-pay

## 2024-01-25 ENCOUNTER — Encounter (HOSPITAL_BASED_OUTPATIENT_CLINIC_OR_DEPARTMENT_OTHER): Payer: Self-pay | Admitting: Emergency Medicine

## 2024-01-25 DIAGNOSIS — R600 Localized edema: Secondary | ICD-10-CM | POA: Diagnosis not present

## 2024-01-25 DIAGNOSIS — M7989 Other specified soft tissue disorders: Secondary | ICD-10-CM | POA: Insufficient documentation

## 2024-01-25 DIAGNOSIS — M25572 Pain in left ankle and joints of left foot: Secondary | ICD-10-CM | POA: Diagnosis not present

## 2024-01-25 DIAGNOSIS — M109 Gout, unspecified: Secondary | ICD-10-CM | POA: Insufficient documentation

## 2024-01-25 LAB — CBC WITH DIFFERENTIAL/PLATELET
Abs Immature Granulocytes: 0.05 K/uL (ref 0.00–0.07)
Basophils Absolute: 0.1 K/uL (ref 0.0–0.1)
Basophils Relative: 1 %
Eosinophils Absolute: 0.2 K/uL (ref 0.0–0.5)
Eosinophils Relative: 2 %
HCT: 38 % — ABNORMAL LOW (ref 39.0–52.0)
Hemoglobin: 12.4 g/dL — ABNORMAL LOW (ref 13.0–17.0)
Immature Granulocytes: 0 %
Lymphocytes Relative: 19 %
Lymphs Abs: 2.1 K/uL (ref 0.7–4.0)
MCH: 27.1 pg (ref 26.0–34.0)
MCHC: 32.6 g/dL (ref 30.0–36.0)
MCV: 83 fL (ref 80.0–100.0)
Monocytes Absolute: 1.1 K/uL — ABNORMAL HIGH (ref 0.1–1.0)
Monocytes Relative: 10 %
Neutro Abs: 7.6 K/uL (ref 1.7–7.7)
Neutrophils Relative %: 68 %
Platelets: 393 K/uL (ref 150–400)
RBC: 4.58 MIL/uL (ref 4.22–5.81)
RDW: 14.5 % (ref 11.5–15.5)
WBC: 11.1 K/uL — ABNORMAL HIGH (ref 4.0–10.5)
nRBC: 0 % (ref 0.0–0.2)

## 2024-01-25 LAB — PRO BRAIN NATRIURETIC PEPTIDE: Pro Brain Natriuretic Peptide: 134 pg/mL (ref <300.0)

## 2024-01-25 LAB — BASIC METABOLIC PANEL WITH GFR
Anion gap: 13 (ref 5–15)
BUN: 12 mg/dL (ref 8–23)
CO2: 25 mmol/L (ref 22–32)
Calcium: 10.3 mg/dL (ref 8.9–10.3)
Chloride: 98 mmol/L (ref 98–111)
Creatinine, Ser: 0.83 mg/dL (ref 0.61–1.24)
GFR, Estimated: >60 mL/min (ref >60)
Glucose, Bld: 98 mg/dL (ref 70–99)
Potassium: 3.8 mmol/L (ref 3.5–5.1)
Sodium: 136 mmol/L (ref 135–145)

## 2024-01-25 MED ORDER — METHYLPREDNISOLONE 4 MG PO TBPK
ORAL_TABLET | ORAL | 0 refills | Status: DC
Start: 2024-01-25 — End: 2024-02-11

## 2024-01-25 NOTE — ED Provider Notes (Signed)
  EMERGENCY DEPARTMENT AT [facility] Provider Note   CSN: [id number] Arrival date & time: 01/25/24  1425     Patient presents with: Leg Swelling   David Arias is a 68 y.o. male.   Patient here with left lower leg swelling.  Sent here for DVT study.  Has been dealing with some leg swelling in both legs.  He had right leg swelling here recently that got better with steroids and now he is having swelling in the left lower leg for the last several days.  Mostly in the left knee down to the left ankle.  He has a history of gout.  He denies any specific trauma or falls.  No fever no chills no rash.  He has a history of high cholesterol IBS.  No history of heart failure.  Denies any chest pain shortness of breath weakness numbness tingling.  The history is provided by the patient.       Prior to Admission medications   Medication Sig Start Date End Date Taking? Authorizing Provider  methylPREDNISolone  (MEDROL  DOSEPAK) 4 MG TBPK tablet Follow package insert 01/25/24  Yes Zoe Goonan, DO  allopurinol  (ZYLOPRIM ) 300 MG tablet Take 1 tablet (300 mg total) by mouth daily. 01/04/24   Smith, Zachary M, DO  diclofenac Sodium (VOLTAREN) 1 % GEL Apply 2 g topically 4 (four) times daily. 12/29/23   Ruthell Lonni FALCON, PA-C  indomethacin  (INDOCIN ) 25 MG capsule Take 25 mg by mouth 2 (two) times daily with a meal.    [provider]  meloxicam  (MOBIC ) 15 MG tablet Take 1 tablet (15 mg total) by mouth daily. 08/17/21   Leonce Katz, DO  meloxicam  (MOBIC ) 15 MG tablet Take 1 tablet (15 mg total) by mouth daily. 06/13/23   Leonce Katz, DO  Vitamin D , Ergocalciferol , (DRISDOL ) 1.25 MG (50000 UNIT) CAPS capsule Take 1 capsule (50,000 Units total) by mouth every 7 (seven) days. 11/22/23   Claudene Arthea HERO, DO    Allergies: Bactrim  ds [sulfamethoxazole -trimethoprim ], Levaquin  [levofloxacin ], and Statins    Review of Systems  Updated Vital Signs BP (!) 176/95   Pulse  92   Temp 99 F (37.2 C)   Resp 16   SpO2 98%   Physical Exam Vitals and nursing note reviewed.  Constitutional:      General: He is not in acute distress.    Appearance: He is well-developed. He is not ill-appearing.  HENT:     Head: Normocephalic and atraumatic.  Eyes:     Extraocular Movements: Extraocular movements intact.     Conjunctiva/sclera: Conjunctivae normal.     Pupils: Pupils are equal, round, and reactive to light.  Cardiovascular:     Rate and Rhythm: Normal rate and regular rhythm.     Pulses: Normal pulses.     Heart sounds: Normal heart sounds. No murmur heard. Pulmonary:     Effort: Pulmonary effort is normal. No respiratory distress.     Breath sounds: Normal breath sounds.  Abdominal:     Palpations: Abdomen is soft.     Tenderness: There is no abdominal tenderness.  Musculoskeletal:        General: Swelling present. No tenderness.     Cervical back: Neck supple.     Comments: Swelling around the left knee left shin and left foot but no cellulitis normal range of motion of the joints without major pain, there is no swelling of the right lower leg  Skin:    General: Skin is warm  and dry.     Capillary Refill: Capillary refill takes less than 2 seconds.  Neurological:     General: No focal deficit present.     Mental Status: He is alert.     Cranial Nerves: No cranial nerve deficit.     Sensory: No sensory deficit.     Motor: No weakness.  Psychiatric:        Mood and Affect: Mood normal.     (all labs ordered are listed, but only abnormal results are displayed) Labs Reviewed  CBC WITH DIFFERENTIAL/PLATELET - Abnormal; Notable for the following components:      Result Value   WBC 11.1 (*)    Hemoglobin 12.4 (*)    HCT 38.0 (*)    Monocytes Absolute 1.1 (*)    All other components within normal limits  BASIC METABOLIC PANEL WITH GFR  PRO BRAIN NATRIURETIC PEPTIDE    EKG: None  Radiology: DG Chest Portable 1 View Result Date:  01/25/2024 EXAM: 1 VIEW XRAY OF THE CHEST 01/25/2024 05:00:12 PM COMPARISON: Chest x-ray 09/25/2022. CLINICAL HISTORY: fluid. FINDINGS: LUNGS AND PLEURA: No focal pulmonary opacity. No pulmonary edema. No pleural effusion. No pneumothorax. HEART AND MEDIASTINUM: No acute abnormality of the cardiac and mediastinal silhouettes. BONES AND SOFT TISSUES: No acute osseous abnormality. IMPRESSION: 1. No acute process. Electronically signed by: Greig Pique MD 01/25/2024 05:39 PM EDT RP Workstation: [id number]   DG Ankle Complete Left Result Date: 01/25/2024 CLINICAL DATA:  pain EXAM: LEFT ANKLE COMPLETE - 3+ VIEW COMPARISON:  12/28/2023 FINDINGS: No acute fracture or dislocation. No ankle mortise widening. The talar dome is intact. There is no evidence of arthropathy or other focal bone abnormality. Soft tissue swelling about the ankle. IMPRESSION: Soft tissue swelling about the ankle. No acute fracture or dislocation. Electronically Signed   By: Rogelia Myers M.D.   On: 01/25/2024 17:36   DG Knee Complete 4 Views Left Result Date: 01/25/2024 CLINICAL DATA:  Left leg swelling EXAM: LEFT KNEE - COMPLETE 4+ VIEW COMPARISON:  None Available. FINDINGS: No acute fracture or dislocation. No joint effusion. There is no evidence of arthropathy or other focal bone abnormality. Subcutaneous edema is likely present. Peripheral vascular atherosclerosis. IMPRESSION: Subcutaneous edema is likely present. No acute fracture or dislocation. Electronically Signed   By: Rogelia Myers M.D.   On: 01/25/2024 17:35   US  Venous Img Lower Unilateral Left Result Date: 01/25/2024 CLINICAL DATA:  Left lower extremity swelling for 4 days EXAM: LEFT LOWER EXTREMITY VENOUS DOPPLER ULTRASOUND TECHNIQUE: Gray-scale sonography with graded compression, as well as color Doppler and duplex ultrasound were performed to evaluate the lower extremity deep venous systems from the level of the common femoral vein and including the common femoral,  femoral, profunda femoral, popliteal and calf veins including the posterior tibial, peroneal and gastrocnemius veins when visible. The superficial great saphenous vein was also interrogated. Spectral Doppler was utilized to evaluate flow at rest and with distal augmentation maneuvers in the common femoral, femoral and popliteal veins. COMPARISON:  None Available. FINDINGS: Contralateral Common Femoral Vein: Respiratory phasicity is normal and symmetric with the symptomatic side. No evidence of thrombus. Normal compressibility. Common Femoral Vein: No evidence of thrombus. Normal compressibility, respiratory phasicity and response to augmentation. Saphenofemoral Junction: No evidence of thrombus. Normal compressibility and flow on color Doppler imaging. Profunda Femoral Vein: No evidence of thrombus. Normal compressibility and flow on color Doppler imaging. Femoral Vein: No evidence of thrombus. Normal compressibility, respiratory phasicity and response to augmentation. Popliteal Vein:  No evidence of thrombus. Normal compressibility, respiratory phasicity and response to augmentation. Calf Veins: No evidence of thrombus. Normal compressibility and flow on color Doppler imaging. Superficial Great Saphenous Vein: No evidence of thrombus. Normal compressibility. Other Findings: Peripheral arterial calcified atherosclerosis incidentally noted throughout the left lower extremity. IMPRESSION: No evidence of deep venous thrombosis. Electronically Signed   By: CHRISTELLA.  Shick M.D.   On: 01/25/2024 16:16     Procedures   Medications Ordered in the ED - No data to display                                  Medical Decision Making Amount and/or Complexity of Data Reviewed Labs: ordered. Radiology: ordered.  Risk Prescription drug management.   David Arias is here with swelling to the left lower leg.  He has a history of IBS high cholesterol.  He was just treated for around the steroids for some swelling to his right  lower leg.  He has no heart failure history.  No blood clot history.  He was sent here by primary care doctor to rule out blood clot in the left lower leg but started to swell here the last several days.  Right leg does not have any swelling.  He has got swelling from what appears to be the left knee down.  Does not appear to be cellulitic.  He got good range of motion of the left knee and left ankle without much discomfort.  Swelling does seem to be a little bit more focal around the left knee and maybe the left ankle foot area.  He denies any rash.  He had similar process happen to the right leg recently.  Differential diagnosis could be arthritic process could be an inflammatory process I do not think that this is an infectious process or heart failure or kidney failure or liver failure process.  He just had unremarkable labs a few weeks ago.  Will recheck his CBC BMP proBNP chest x-ray left knee x-ray left foot x-ray.  EKG shows sinus rhythm.  No ischemic changes.  Will make sure is not some sort of heart failure.  He does not have any abdominal pain.  He actually just had an MRI of his abdomen and pelvis that was unremarkable as well here the last few days.  There is no signs of any sort of lymphadenopathy or cancerous process.  There is no rash.  This could be some sort of rheumatological process that might need to be worked up outpatient but will rule out emergent processes on my and and anticipate put him on steroids if lab works unremarkable.  DVT study is negative.  No Baker's cyst, no blood clot.  He is Strong pulses in his legs bilaterally.  I doubt peripheral arterial occlusion or vascular process in the left lower leg or right lower leg.  Overall no significant leukocytosis anemia or electrolyte abnormality per my review interpretation labs.  BMP is unremarkable.  Chest x-ray with no evidence of pneumonia pneumothorax.  X-rays were unremarkable.  Just soft tissue edema.  Ultimately I do think this  could be an inflammatory process.  Will treat with steroids.  Will have him follow-up with primary care.  Discharged in good condition.  Understands return precautions.  This chart was dictated using voice recognition software.  Despite best efforts to proofread,  errors can occur which can change the documentation meaning.  Final diagnoses:  Leg swelling    ED Discharge Orders          Ordered    methylPREDNISolone  (MEDROL  DOSEPAK) 4 MG TBPK tablet        01/25/24 1846               Ruthe Cornet, DO 01/25/24 1847

## 2024-01-25 NOTE — Discharge Instructions (Signed)
 Follow-up with your primary care doctor return if symptoms worsen.

## 2024-01-25 NOTE — ED Notes (Signed)
 Reviewed AVS/discharge instruction with patient. Time allotted for and all questions answered. Patient is agreeable for d/c and escorted to car by staff.

## 2024-01-25 NOTE — ED Triage Notes (Signed)
 Left leg swelling since Tuesday Ongoing problem, but worse since Tuesday PCP suggested going down to [facility] to make sure its not a clot

## 2024-01-30 ENCOUNTER — Other Ambulatory Visit: Payer: Self-pay

## 2024-01-30 DIAGNOSIS — M109 Gout, unspecified: Secondary | ICD-10-CM

## 2024-02-08 ENCOUNTER — Telehealth: Payer: Self-pay | Admitting: Family Medicine

## 2024-02-08 NOTE — Telephone Encounter (Signed)
 Pt requesting refill of steroid dosepak (looks like last filled by [facility] ED) as the foot swelling continues.  [facility] on file

## 2024-02-11 ENCOUNTER — Other Ambulatory Visit: Payer: Self-pay | Admitting: Family Medicine

## 2024-02-11 ENCOUNTER — Other Ambulatory Visit: Payer: Self-pay

## 2024-02-11 MED ORDER — METHYLPREDNISOLONE 4 MG PO TBPK: ORAL_TABLET | ORAL | 0 refills | Status: AC

## 2024-03-22 ENCOUNTER — Other Ambulatory Visit: Payer: Self-pay | Admitting: Family Medicine

## 2024-03-24 ENCOUNTER — Other Ambulatory Visit: Payer: Self-pay

## 2024-03-24 MED ORDER — ALLOPURINOL 300 MG PO TABS: 300.0000 mg | ORAL_TABLET | Freq: Every day | ORAL | 0 refills | Status: AC

## 2024-05-09 ENCOUNTER — Ambulatory Visit: Admitting: Internal Medicine

## 2024-07-03 ENCOUNTER — Ambulatory Visit: Admitting: Rheumatology

## 2024-08-06 ENCOUNTER — Ambulatory Visit: Admitting: Rheumatology
